# Patient Record
Sex: Female | Born: 1960 | Race: White | Hispanic: No | Marital: Married | Smoking: Never smoker
Health system: Southern US, Community
[De-identification: ages and names within clinical notes are randomized; demographics above are authoritative.]

## PROBLEM LIST (undated history)

## (undated) DIAGNOSIS — A071 Giardiasis [lambliasis]: Secondary | ICD-10-CM

## (undated) DIAGNOSIS — I82409 Acute embolism and thrombosis of unspecified deep veins of unspecified lower extremity: Secondary | ICD-10-CM

## (undated) DIAGNOSIS — E7800 Pure hypercholesterolemia, unspecified: Secondary | ICD-10-CM

## (undated) DIAGNOSIS — Z923 Personal history of irradiation: Secondary | ICD-10-CM

## (undated) DIAGNOSIS — C50919 Malignant neoplasm of unspecified site of unspecified female breast: Secondary | ICD-10-CM

## (undated) DIAGNOSIS — D6851 Activated protein C resistance: Secondary | ICD-10-CM

## (undated) HISTORY — PX: FRACTURE SURGERY: SHX138

## (undated) HISTORY — PX: COSMETIC SURGERY: SHX468

## (undated) HISTORY — DX: Pure hypercholesterolemia, unspecified: E78.00

---

## 1996-09-04 HISTORY — PX: DILATION AND CURETTAGE OF UTERUS: SHX78

## 2007-07-30 ENCOUNTER — Other Ambulatory Visit: Admission: RE | Admit: 2007-07-30 | Discharge: 2007-07-30 | Payer: Self-pay | Admitting: Family Medicine

## 2008-05-20 ENCOUNTER — Encounter: Admission: RE | Admit: 2008-05-20 | Discharge: 2008-05-20 | Payer: Self-pay | Admitting: Family Medicine

## 2008-05-22 ENCOUNTER — Encounter: Admission: RE | Admit: 2008-05-22 | Discharge: 2008-05-22 | Payer: Self-pay | Admitting: Family Medicine

## 2008-08-05 ENCOUNTER — Other Ambulatory Visit: Admission: RE | Admit: 2008-08-05 | Discharge: 2008-08-05 | Payer: Self-pay | Admitting: Family Medicine

## 2008-11-11 ENCOUNTER — Encounter: Admission: RE | Admit: 2008-11-11 | Discharge: 2008-11-11 | Payer: Self-pay | Admitting: Family Medicine

## 2009-06-16 ENCOUNTER — Encounter: Admission: RE | Admit: 2009-06-16 | Discharge: 2009-06-16 | Payer: Self-pay | Admitting: Family Medicine

## 2009-08-19 ENCOUNTER — Other Ambulatory Visit: Admission: RE | Admit: 2009-08-19 | Discharge: 2009-08-19 | Payer: Self-pay | Admitting: Family Medicine

## 2010-07-21 ENCOUNTER — Encounter: Admission: RE | Admit: 2010-07-21 | Discharge: 2010-07-21 | Payer: Self-pay | Admitting: Family Medicine

## 2010-09-26 ENCOUNTER — Encounter: Payer: Self-pay | Admitting: Family Medicine

## 2010-10-13 ENCOUNTER — Other Ambulatory Visit (HOSPITAL_COMMUNITY)
Admission: RE | Admit: 2010-10-13 | Discharge: 2010-10-13 | Disposition: A | Discharge status: Home / Self Care | Payer: BC Managed Care – PPO | Source: Ambulatory Visit | Attending: Family Medicine | Admitting: Family Medicine

## 2010-10-13 ENCOUNTER — Other Ambulatory Visit: Payer: Self-pay | Admitting: Family Medicine

## 2010-10-13 DIAGNOSIS — Z124 Encounter for screening for malignant neoplasm of cervix: Secondary | ICD-10-CM | POA: Insufficient documentation

## 2011-06-22 ENCOUNTER — Other Ambulatory Visit: Payer: Self-pay | Admitting: Family Medicine

## 2011-06-22 DIAGNOSIS — Z1231 Encounter for screening mammogram for malignant neoplasm of breast: Secondary | ICD-10-CM

## 2011-08-01 ENCOUNTER — Ambulatory Visit
Admission: RE | Admit: 2011-08-01 | Discharge: 2011-08-01 | Disposition: A | Discharge status: Home / Self Care | Payer: BC Managed Care – PPO | Source: Ambulatory Visit | Attending: Family Medicine | Admitting: Family Medicine

## 2011-08-01 DIAGNOSIS — Z1231 Encounter for screening mammogram for malignant neoplasm of breast: Secondary | ICD-10-CM

## 2011-09-19 ENCOUNTER — Other Ambulatory Visit: Payer: Self-pay | Admitting: Dermatology

## 2011-10-19 ENCOUNTER — Other Ambulatory Visit (HOSPITAL_COMMUNITY)
Admission: RE | Admit: 2011-10-19 | Discharge: 2011-10-19 | Disposition: A | Discharge status: Home / Self Care | Payer: BC Managed Care – PPO | Source: Ambulatory Visit | Attending: Family Medicine | Admitting: Family Medicine

## 2011-10-19 ENCOUNTER — Other Ambulatory Visit: Payer: Self-pay | Admitting: Family Medicine

## 2011-10-19 DIAGNOSIS — Z124 Encounter for screening for malignant neoplasm of cervix: Secondary | ICD-10-CM | POA: Insufficient documentation

## 2011-10-19 DIAGNOSIS — Z1159 Encounter for screening for other viral diseases: Secondary | ICD-10-CM | POA: Insufficient documentation

## 2012-06-28 ENCOUNTER — Other Ambulatory Visit: Payer: Self-pay | Admitting: Family Medicine

## 2012-06-28 DIAGNOSIS — Z1231 Encounter for screening mammogram for malignant neoplasm of breast: Secondary | ICD-10-CM

## 2012-08-06 ENCOUNTER — Ambulatory Visit
Admission: RE | Admit: 2012-08-06 | Discharge: 2012-08-06 | Disposition: A | Discharge status: Home / Self Care | Payer: BC Managed Care – PPO | Source: Ambulatory Visit | Attending: Family Medicine | Admitting: Family Medicine

## 2012-08-06 DIAGNOSIS — Z1231 Encounter for screening mammogram for malignant neoplasm of breast: Secondary | ICD-10-CM

## 2013-01-06 ENCOUNTER — Other Ambulatory Visit: Payer: Self-pay | Admitting: Family Medicine

## 2013-01-06 ENCOUNTER — Other Ambulatory Visit (HOSPITAL_COMMUNITY)
Admission: RE | Admit: 2013-01-06 | Discharge: 2013-01-06 | Disposition: A | Discharge status: Home / Self Care | Payer: BC Managed Care – PPO | Source: Ambulatory Visit | Attending: Family Medicine | Admitting: Family Medicine

## 2013-01-06 DIAGNOSIS — Z124 Encounter for screening for malignant neoplasm of cervix: Secondary | ICD-10-CM | POA: Insufficient documentation

## 2013-07-08 ENCOUNTER — Other Ambulatory Visit: Payer: Self-pay

## 2013-07-08 DIAGNOSIS — Z1231 Encounter for screening mammogram for malignant neoplasm of breast: Secondary | ICD-10-CM

## 2013-08-07 ENCOUNTER — Ambulatory Visit
Admission: RE | Admit: 2013-08-07 | Discharge: 2013-08-07 | Disposition: A | Discharge status: Home / Self Care | Payer: BC Managed Care – PPO | Source: Ambulatory Visit

## 2013-08-07 DIAGNOSIS — Z1231 Encounter for screening mammogram for malignant neoplasm of breast: Secondary | ICD-10-CM

## 2014-07-20 ENCOUNTER — Other Ambulatory Visit: Payer: Self-pay

## 2014-07-20 DIAGNOSIS — Z1231 Encounter for screening mammogram for malignant neoplasm of breast: Secondary | ICD-10-CM

## 2014-08-10 ENCOUNTER — Ambulatory Visit
Admission: RE | Admit: 2014-08-10 | Discharge: 2014-08-10 | Disposition: A | Discharge status: Home / Self Care | Payer: BC Managed Care – PPO | Source: Ambulatory Visit

## 2014-08-10 DIAGNOSIS — Z1231 Encounter for screening mammogram for malignant neoplasm of breast: Secondary | ICD-10-CM

## 2015-07-15 ENCOUNTER — Other Ambulatory Visit: Payer: Self-pay

## 2015-07-15 DIAGNOSIS — Z1231 Encounter for screening mammogram for malignant neoplasm of breast: Secondary | ICD-10-CM

## 2015-08-19 ENCOUNTER — Ambulatory Visit
Admission: RE | Admit: 2015-08-19 | Discharge: 2015-08-19 | Disposition: A | Discharge status: Home / Self Care | Payer: BLUE CROSS/BLUE SHIELD | Source: Ambulatory Visit

## 2015-08-19 DIAGNOSIS — Z1231 Encounter for screening mammogram for malignant neoplasm of breast: Secondary | ICD-10-CM

## 2015-11-12 ENCOUNTER — Other Ambulatory Visit: Payer: Self-pay | Admitting: Family Medicine

## 2015-11-12 ENCOUNTER — Other Ambulatory Visit (HOSPITAL_COMMUNITY)
Admission: RE | Admit: 2015-11-12 | Discharge: 2015-11-12 | Disposition: A | Discharge status: Home / Self Care | Payer: BLUE CROSS/BLUE SHIELD | Source: Ambulatory Visit | Attending: Family Medicine | Admitting: Family Medicine

## 2015-11-12 DIAGNOSIS — Z1151 Encounter for screening for human papillomavirus (HPV): Secondary | ICD-10-CM | POA: Diagnosis not present

## 2015-11-12 DIAGNOSIS — Z01411 Encounter for gynecological examination (general) (routine) with abnormal findings: Secondary | ICD-10-CM | POA: Diagnosis present

## 2015-11-15 LAB — CYTOLOGY - PAP

## 2016-06-04 DIAGNOSIS — I82409 Acute embolism and thrombosis of unspecified deep veins of unspecified lower extremity: Secondary | ICD-10-CM

## 2016-06-04 HISTORY — DX: Acute embolism and thrombosis of unspecified deep veins of unspecified lower extremity: I82.409

## 2016-06-18 ENCOUNTER — Emergency Department (HOSPITAL_COMMUNITY)
Admission: EM | Admit: 2016-06-18 | Discharge: 2016-06-18 | Disposition: A | Discharge status: Home / Self Care | Payer: BLUE CROSS/BLUE SHIELD | Attending: Emergency Medicine | Admitting: Emergency Medicine

## 2016-06-18 ENCOUNTER — Encounter (HOSPITAL_COMMUNITY): Payer: Self-pay | Admitting: Emergency Medicine

## 2016-06-18 ENCOUNTER — Emergency Department (HOSPITAL_BASED_OUTPATIENT_CLINIC_OR_DEPARTMENT_OTHER)
Admit: 2016-06-18 | Discharge: 2016-06-18 | Disposition: A | Discharge status: Home / Self Care | Payer: BLUE CROSS/BLUE SHIELD

## 2016-06-18 DIAGNOSIS — M79609 Pain in unspecified limb: Secondary | ICD-10-CM | POA: Diagnosis not present

## 2016-06-18 DIAGNOSIS — M7989 Other specified soft tissue disorders: Secondary | ICD-10-CM | POA: Diagnosis not present

## 2016-06-18 DIAGNOSIS — I82492 Acute embolism and thrombosis of other specified deep vein of left lower extremity: Secondary | ICD-10-CM | POA: Diagnosis not present

## 2016-06-18 DIAGNOSIS — M79662 Pain in left lower leg: Secondary | ICD-10-CM | POA: Diagnosis present

## 2016-06-18 HISTORY — DX: Acute embolism and thrombosis of unspecified deep veins of unspecified lower extremity: I82.409

## 2016-06-18 LAB — I-STAT CHEM 8, ED
BUN: 16 mg/dL (ref 6–20)
Calcium, Ion: 1.18 mmol/L (ref 1.15–1.40)
Chloride: 102 mmol/L (ref 101–111)
Creatinine, Ser: 0.7 mg/dL (ref 0.44–1.00)
Glucose, Bld: 91 mg/dL (ref 65–99)
HCT: 37 % (ref 36.0–46.0)
Hemoglobin: 12.6 g/dL (ref 12.0–15.0)
Potassium: 4 mmol/L (ref 3.5–5.1)
Sodium: 138 mmol/L (ref 135–145)
TCO2: 25 mmol/L (ref 0–100)

## 2016-06-18 MED ORDER — RIVAROXABAN (XARELTO) EDUCATION KIT FOR DVT/PE PATIENTS
PACK | Freq: Once | Status: AC
  Administered 2016-06-18: 22:00:00
  Filled 2016-06-18: qty 1

## 2016-06-18 MED ORDER — RIVAROXABAN (XARELTO) VTE STARTER PACK (15 & 20 MG): ORAL_TABLET | ORAL | 0 refills | Status: DC

## 2016-06-18 MED ORDER — RIVAROXABAN 15 MG PO TABS
15.0000 mg | ORAL_TABLET | ORAL | Status: AC
  Administered 2016-06-18: 15 mg via ORAL
  Filled 2016-06-18: qty 1

## 2016-06-18 NOTE — Progress Notes (Signed)
ANTICOAGULATION CONSULT NOTE - Initial Consult  Pharmacy Consult for Xarelto Indication: DVT  No Known Allergies  Patient Measurements: Height: 5\' 7"  (170.2 cm) Weight: 147 lb (66.7 kg) IBW/kg (Calculated) : 61.6  Vital Signs: Temp: 97.9 F (36.6 C) (10/15 1603) Temp Source: Oral (10/15 1603) BP: 100/83 (10/15 2038) Pulse Rate: 69 (10/15 2038)  Labs:  Recent Labs  06/18/16 2049  HGB 12.6  HCT 37.0  CREATININE 0.70    Estimated Creatinine Clearance: 79.1 mL/min (by C-G formula based on SCr of 0.7 mg/dL).   Medical History: Past Medical History:  Diagnosis Date  . DVT (deep venous thrombosis) (HCC)     Medications:  No prescription medications prior to admission   Assessment: 55 yo female with a hx factor V Leiden deficiency, now (+) DVT after recent travel. Pharmacy consulted to dose Xarelto.  Goal of Therapy:  Therapeutic anticoagulation   Plan:  Xarelto 15mg  PO bid x 21 days, then 20mg  PO daily Spoke with patient and education materials provided   Peggyann Juba, PharmD, BCPS Pager: [phone] 06/18/2016,8:51 PM

## 2016-06-18 NOTE — ED Triage Notes (Signed)
Patient c/o left lower leg pain and swelling since last week.  Patient flew last week and returned yesterday.  Patient had DVT 20 years ago and states that symptoms feel the same. Patient denies any blood thinners.

## 2016-06-18 NOTE — ED Provider Notes (Signed)
[facility] DEPT Provider Note   CSN: [id number] Arrival date & time: 06/18/16  1551     History   Chief Complaint Chief Complaint  Patient presents with  . Leg Pain  . Leg Swelling    HPI Alyssa Riley is a 55 y.o. female with a past medical history of DVT who presents to the ED stay complaining of left calf pain and swelling onset 6 days ago. Patient states that she flew back from [place] 6 days ago. Since that time she has had ongoing and worsening left calf pain and minimal swelling. Patient states she had a DVT 20 years ago and this feels exactly the same way that it did before. She was diagnosed with factor V Leiden deficiency. She no longer takes anticoagulation therapy. She denies any chest pain, shortness of breath.  HPI  Past Medical History:  Diagnosis Date  . DVT (deep venous thrombosis) ([facility])     There are no active problems to display for this patient.   Past Surgical History:  Procedure Laterality Date  . DILATION AND CURETTAGE OF UTERUS    . FRACTURE SURGERY      OB History    No data available       Home Medications    Prior to Admission medications   Not on File    Family History No family history on file.  Social History Social History  Substance Use Topics  . Smoking status: Never Smoker  . Smokeless tobacco: Never Used  . Alcohol use Yes     Comment: social      Allergies   Review of patient's allergies indicates no known allergies.   Review of Systems Review of Systems  All other systems reviewed and are negative.    Physical Exam Updated Vital Signs BP 123/56   Pulse 70   Temp 97.9 F (36.6 C) (Oral)   Resp 16   Ht 5\' 7"  (1.702 m)   Wt 66.7 kg   SpO2 100%   BMI 23.02 kg/m   Physical Exam  Constitutional: She is oriented to person, place, and time. She appears well-developed and well-nourished. No distress.  HENT:  Head: Normocephalic and atraumatic.  Eyes: Conjunctivae are normal. Right eye exhibits  no discharge. Left eye exhibits no discharge. No scleral icterus.  Cardiovascular: Normal rate, regular rhythm and normal heart sounds.   Pulmonary/Chest: Effort normal and breath sounds normal.  Musculoskeletal:  TTP over left cal, minimal swelling. Positive Homan's sign. Intact distal pulses. Skin warm and pink  Neurological: She is alert and oriented to person, place, and time. Coordination normal.  Skin: Skin is warm and dry. No rash noted. She is not diaphoretic. No erythema. No pallor.  Psychiatric: She has a normal mood and affect. Her behavior is normal.  Nursing note and vitals reviewed.    ED Treatments / Results  Labs (all labs ordered are listed, but only abnormal results are displayed) Labs Reviewed - No data to display  EKG  EKG Interpretation None       Radiology No results found.  Procedures Procedures (including critical care time)  Medications Ordered in ED Medications - No data to display   Initial Impression / Assessment and Plan / ED Course  I have reviewed the triage vital signs and the nursing notes.  Pertinent labs & imaging results that were available during my care of the patient were reviewed by me and considered in my medical decision making (see chart for details).  Clinical  Course   55 year old female with history of factor V Leiden deficiency presents the ED today complaining of left calf pain onset 5 days ago after a flight to [place]. She had DVT in the same calf 20 years ago and states this feels similar. DVT noted on ultrasound in the gastrocnemius vein as well as in the popliteal vein. No chest pain or shortness of breath to suggest PE. Vitals are stable. Spoke with pharmacy, will obtain istat Chem 8 to evaluate renal function. First dose of 15 mg will be given here. Pharmacy will be by to educate pt and consult in ED as well.  Patient has normal renal function. We'll write prescription for Xarelto starter pack and she will follow-up  with PCP next week for further evaluation and medication management.  Final Clinical Impressions(s) / ED Diagnoses   Final diagnoses:  Acute deep vein thrombosis (DVT) of other specified vein of left lower extremity [facility])    New Prescriptions Discharge Medication List as of 06/18/2016  9:38 PM    START taking these medications   Details  Rivaroxaban 15 & 20 MG TBPK Take as directed on package: Start with one 15mg  tablet by mouth twice a day with food. On Day 22, switch to one 20mg  tablet once a day with food., Print         Dondra Spry [place], [place] 06/18/16 2208    Isla Pence, MD 06/18/16 2308

## 2016-06-18 NOTE — ED Notes (Signed)
Vas Tech in to room.

## 2016-06-18 NOTE — Progress Notes (Addendum)
VASCULAR LAB PRELIMINARY  PRELIMINARY  PRELIMINARY  PRELIMINARY  Left lower extremity venous duplex completed.    Preliminary report:  There is acute DVT noted throughout the gastrocnemius vein.  There is significant sluggish flow noted in the popliteal vein, but it compresses well and has adequate Doppler flow.  Gave report to [employer], PA-C  Xochil Shanker, RVT 06/18/2016, 8:11 PM

## 2016-06-18 NOTE — Discharge Instructions (Addendum)
Information on my medicine - XARELTO (rivaroxaban)  This medication education was reviewed with me or my healthcare representative as part of my discharge preparation.  The pharmacist that spoke with me during my hospital stay was:  Emiliano Dyer, [place] [facility]  WHY WAS [place] YOU? Xarelto was prescribed to treat blood clots that may have been found in the veins of your legs (deep vein thrombosis) or in your lungs (pulmonary embolism) and to reduce the risk of them occurring again.  What do you need to know about Xarelto? The starting dose is one 15 mg tablet taken TWICE daily with food for the FIRST 21 DAYS then on November 6th, the dose is changed to one 20 mg tablet taken ONCE A DAY with your evening meal.  DO NOT stop taking Xarelto without talking to the health care provider who prescribed the medication.  Refill your prescription for 20 mg tablets before you run out.  After discharge, you should have regular check-up appointments with your healthcare provider that is prescribing your Xarelto.  In the future your dose may need to be changed if your kidney function changes by a significant amount.  What do you do if you miss a dose? If you are taking Xarelto TWICE DAILY and you miss a dose, take it as soon as you remember. You may take two 15 mg tablets (total 30 mg) at the same time then resume your regularly scheduled 15 mg twice daily the next day.  If you are taking Xarelto ONCE DAILY and you miss a dose, take it as soon as you remember on the same day then continue your regularly scheduled once daily regimen the next day. Do not take two doses of Xarelto at the same time.   Important Safety Information Xarelto is a blood thinner medicine that can cause bleeding. You should call your healthcare provider right away if you experience any of the following: ? Bleeding from an injury or your nose that does not stop. ? Unusual colored urine (red or dark brown) or unusual  colored stools (red or black). ? Unusual bruising for unknown reasons. ? A serious fall or if you hit your head (even if there is no bleeding).  Some medicines may interact with Xarelto and might increase your risk of bleeding while on Xarelto. To help avoid this, consult your healthcare provider or pharmacist prior to using any new prescription or non-prescription medications, including herbals, vitamins, non-steroidal anti-inflammatory drugs (NSAIDs) and supplements.  This website has more information on Xarelto: [email or web address]

## 2016-07-18 ENCOUNTER — Encounter: Payer: Self-pay | Admitting: Hematology

## 2016-08-01 ENCOUNTER — Other Ambulatory Visit: Payer: Self-pay | Admitting: Family Medicine

## 2016-08-01 DIAGNOSIS — Z1231 Encounter for screening mammogram for malignant neoplasm of breast: Secondary | ICD-10-CM

## 2016-08-23 ENCOUNTER — Ambulatory Visit: Payer: BLUE CROSS/BLUE SHIELD

## 2016-08-31 ENCOUNTER — Ambulatory Visit
Admission: RE | Admit: 2016-08-31 | Discharge: 2016-08-31 | Disposition: A | Discharge status: Home / Self Care | Payer: BLUE CROSS/BLUE SHIELD | Source: Ambulatory Visit | Attending: Family Medicine | Admitting: Family Medicine

## 2016-08-31 DIAGNOSIS — Z1231 Encounter for screening mammogram for malignant neoplasm of breast: Secondary | ICD-10-CM

## 2016-09-04 DIAGNOSIS — Z923 Personal history of irradiation: Secondary | ICD-10-CM

## 2016-09-04 HISTORY — DX: Personal history of irradiation: Z92.3

## 2016-09-05 ENCOUNTER — Other Ambulatory Visit: Payer: Self-pay | Admitting: Family Medicine

## 2016-09-05 DIAGNOSIS — R928 Other abnormal and inconclusive findings on diagnostic imaging of breast: Secondary | ICD-10-CM

## 2016-09-07 ENCOUNTER — Encounter: Payer: Self-pay | Admitting: Hematology

## 2016-09-07 ENCOUNTER — Ambulatory Visit (HOSPITAL_BASED_OUTPATIENT_CLINIC_OR_DEPARTMENT_OTHER): Payer: Self-pay | Admitting: Hematology

## 2016-09-07 VITALS — BP 102/61 | HR 78 | Temp 98.7°F | Resp 18 | Ht 67.0 in | Wt 149.0 lb

## 2016-09-07 DIAGNOSIS — D6851 Activated protein C resistance: Secondary | ICD-10-CM

## 2016-09-07 DIAGNOSIS — I82492 Acute embolism and thrombosis of other specified deep vein of left lower extremity: Secondary | ICD-10-CM

## 2016-09-07 DIAGNOSIS — I82401 Acute embolism and thrombosis of unspecified deep veins of right lower extremity: Secondary | ICD-10-CM

## 2016-09-07 DIAGNOSIS — N649 Disorder of breast, unspecified: Secondary | ICD-10-CM

## 2016-09-07 NOTE — Progress Notes (Signed)
[facility]    HEMATOLOGY/ONCOLOGY CONSULTATION NOTE  Date of Service: 09/07/2016  PCP: Dr Jonathon Jordan MD  [place]:  H/o DVT in the setting of k/h/o Factor V leiden mutation  HISTORY OF PRESENTING ILLNESS:   Alyssa Riley is a wonderful 56 y.o. female who has been referred to [place] by Dr Audley Hose for evaluation and management of acute DVT in a patient with known history of Factor V leiden  Mutation.  Patient notes that she has a known history of factor V leiden mutation (she notes it was probably heterozygous mutation) and had a LLE DVT in 1997 which was apparently treated with coumadin anticoagulation for 3 months. It was thought to be provoked by a long distance flight from [place] at that time.  Prior to this she apparently was on OCP's for several years without any VTE events  She notes that she has since had 4 pregnancies and 2 live births. Was on lovenox prophylaxis during her pregnancies and did not have any VTE events.  Patient recently in 06/2016 had left leg discomfort after working out in the [place] and flight to [place]. Had an [place] LE venous which showed acute DVT of left gastrocnemium vein. Patient has been on anticoagulation with Xarelto for this and notes that all her symptoms from the DVT have resolved.  No chest pain or shortness of breath.  No current use of HRT. No recent surgeries. No smoker. No other obvious provoking factors. No overt injuries to the leg.  MEDICAL HISTORY:  Past Medical History:  Diagnosis Date  . DVT (deep venous thrombosis) (HCC)   factor V leiden mutation.  SURGICAL HISTORY: Past Surgical History:  Procedure Laterality Date  . DILATION AND CURETTAGE OF UTERUS    . FRACTURE SURGERY      SOCIAL HISTORY: Social History   Social History  . Marital status: Married    Spouse name: N/A  . Number of children: N/A  . Years of education: N/A   Occupational History  . Not on file.   Social History Main Topics    . Smoking status: Never Smoker  . Smokeless tobacco: Never Used  . Alcohol use Yes     Comment: social   . Drug use: No  . Sexual activity: Not on file   Other Topics Concern  . Not on file   Social History Narrative  . No narrative on file    FAMILY HISTORY: Patient notes she was adopted and does not know her biological parents family history.   ALLERGIES:  has No Known Allergies.  MEDICATIONS:  Current Outpatient Prescriptions  Medication Sig Dispense Refill  . Rivaroxaban 15 & 20 MG TBPK Take as directed on package: Start with one 15mg  tablet by mouth twice a day with food. On Day 22, switch to one 20mg  tablet once a day with food. 51 each 0   No current facility-administered medications for this visit.     REVIEW OF SYSTEMS:    10 Point review of Systems was done is negative except as noted above.  PHYSICAL EXAMINATION: ECOG PERFORMANCE STATUS: 0 - Asymptomatic  . Vitals:   09/07/16 1402  BP: 102/61  Pulse: 78  Resp: 18  Temp: 98.7 F (37.1 C)   Filed Weights   09/07/16 1402  Weight: 149 lb (67.6 kg)   .Body mass index is 23.34 kg/m.  GENERAL:alert, in no acute distress and comfortable SKIN: skin color, texture, turgor are normal, no rashes or significant lesions  EYES: normal, conjunctiva are pink and non-injected, sclera clear OROPHARYNX:no exudate, no erythema and lips, buccal mucosa, and tongue normal  NECK: supple, no JVD, thyroid normal size, non-tender, without nodularity LYMPH:  no palpable lymphadenopathy in the cervical, axillary or inguinal LUNGS: clear to auscultation with normal respiratory effort HEART: regular rate & rhythm,  no murmurs and no lower extremity edema ABDOMEN: abdomen soft, non-tender, normoactive bowel sounds , no palpable hepato-splenomegaly. Musculoskeletal: no leg redness/tenderness or pain or swelling at this time.  PSYCH: alert & oriented x 3 with fluent speech NEURO: no focal motor/sensory deficits  LABORATORY  DATA:  I have reviewed the data as listed  . CBC Latest Ref Rng & Units 06/18/2016  Hemoglobin 12.0 - 15.0 g/dL 12.6  Hematocrit 36.0 - 46.0 % 37.0    . CMP Latest Ref Rng & Units 06/18/2016  Glucose 65 - 99 mg/dL 91  BUN 6 - 20 mg/dL 16  Creatinine 0.44 - 1.00 mg/dL 0.70  Sodium 135 - 145 mmol/L 138  Potassium 3.5 - 5.1 mmol/L 4.0  Chloride 101 - 111 mmol/L 102     RADIOGRAPHIC STUDIES: I have personally reviewed the radiological images as listed and agreed with the findings in the report. Mm Digital Screening Bilateral  Result Date: 09/01/2016 CLINICAL DATA:  Screening. EXAM: DIGITAL SCREENING BILATERAL MAMMOGRAM WITH CAD COMPARISON:  Previous exam(s). ACR Breast Density Category c: The breast tissue is heterogeneously dense, which may obscure small masses. FINDINGS: In the left breast, a possible asymmetry warrants further evaluation. This possible asymmetry is seen within the outer left breast at posterior depth on the CC view, with possible corresponding distortion seen within the upper left breast on the MLO view. In the right breast, no findings suspicious for malignancy. Images were processed with CAD. IMPRESSION: Further evaluation is suggested for possible asymmetry/distortion in the left breast. RECOMMENDATION: Diagnostic mammogram and possibly ultrasound of the left breast. (Code:FI-L-31M) The patient will be contacted regarding the findings, and additional imaging will be scheduled. BI-RADS CATEGORY  0: Incomplete. Need additional imaging evaluation and/or prior mammograms for comparison. Electronically Signed   By: Franki Cabot M.D.   On: 09/01/2016 14:33   [place] ext venous LE b/l  Summary: Findings consistent with acute deep vein thrombosis involving the gastrocnemius vein of the left lower extremity.  Other specific details can be found in the table(s) above. Prepared and Electronically Authenticated by  Servando Snare, MD 2017-10-16T10:08:54  ASSESSMENT & PLAN:     56 yo caucasian female with   1) h/o Factor V leiden mutation (apparently heterozygous -- but we do not have results available to [place]) 2) Recurrent provoked LLE DVT  1st in 1997 provoked by long distance travel from [place] 2nd recently in 06/2016 -- possible provoked with flight to [place] + ?muscle strain with [employer] workout. Increasing age would be an additional factor. Plan -I discussed with the patient that FVL mutation is an inherited thrombophilia. Assuming Heterozygous FVL mutation the risk of VTE is 5-7 fold that of comparable population.If homozygous mutation <=1% of patients - could be 50-70X risk of VTE. -given that her recent event was provoked --would recommend Xarelto for 6 months following by baby ASA lifelong (assuming heterozygous FVL mutation. Would need lifelong anticoagulation if homozygous FVL mutation) -we discuss VTE prevention strategies while in long distance flight. -could consider prophylactic anticoagulation starting 1-2 days prior to long distance travel and for 1 day after completion of travel in the future when off anticoagulation. -if she has any  unprovoked VTE events or clinical significantly VTE events -- one could make a stronger case for consideration of long term anticoagulation. -avoid NSAIDS while on anticoagulation to reduce risk of bleeding. -would avoid HRT -f/u for age appropriate cancer screening with PCP  3) left breast mammographic abnormality -has been scheduled for diagnostic MMG + [place] -to f/u with PCP  Kindly call if any additional questions arise.  All of the patients questions were answered with apparent satisfaction. The patient knows to call the clinic with any problems, questions or concerns.  I spent 45 minutes counseling the patient face to face. The total time spent in the appointment was 60 minutes and more than 50% was on counseling and direct patient cares.    Sullivan Lone MD [place] AAHIVMS [facility] Hematology/Oncology Physician [facility]  (Office):       [phone] (Work cell):  [phone] (Fax):           [phone]  09/07/2016 2:15 PM

## 2016-09-11 ENCOUNTER — Other Ambulatory Visit: Payer: Self-pay | Admitting: Family Medicine

## 2016-09-11 ENCOUNTER — Ambulatory Visit
Admission: RE | Admit: 2016-09-11 | Discharge: 2016-09-11 | Disposition: A | Discharge status: Home / Self Care | Payer: No Typology Code available for payment source | Source: Ambulatory Visit | Attending: Family Medicine | Admitting: Family Medicine

## 2016-09-11 DIAGNOSIS — R928 Other abnormal and inconclusive findings on diagnostic imaging of breast: Secondary | ICD-10-CM

## 2016-09-11 DIAGNOSIS — N6489 Other specified disorders of breast: Secondary | ICD-10-CM

## 2016-09-13 ENCOUNTER — Ambulatory Visit
Admission: RE | Admit: 2016-09-13 | Discharge: 2016-09-13 | Disposition: A | Discharge status: Home / Self Care | Payer: No Typology Code available for payment source | Source: Ambulatory Visit | Attending: Family Medicine | Admitting: Family Medicine

## 2016-09-13 ENCOUNTER — Other Ambulatory Visit: Payer: Self-pay | Admitting: Family Medicine

## 2016-09-13 DIAGNOSIS — N6489 Other specified disorders of breast: Secondary | ICD-10-CM

## 2016-09-13 DIAGNOSIS — C50919 Malignant neoplasm of unspecified site of unspecified female breast: Secondary | ICD-10-CM

## 2016-09-13 HISTORY — DX: Malignant neoplasm of unspecified site of unspecified female breast: C50.919

## 2016-09-25 ENCOUNTER — Ambulatory Visit: Payer: Self-pay | Admitting: Surgery

## 2016-09-25 DIAGNOSIS — N6092 Unspecified benign mammary dysplasia of left breast: Secondary | ICD-10-CM

## 2016-09-25 NOTE — H&P (Signed)
History of Present Illness Alyssa Riley. Alyssa Craun MD; 09/25/2016 10:46 AM) The patient is a 56 year old female who presents with a breast mass. 56 yo female in good health presents after recent screening mammogram. This showed an area of asymmetry in the upper outer quadrant at posterior depth. Further workup including a core biopsy showed atypical ductal hyperplasia with microcalcifications. A clip was placed. She presents now to discuss surgical options. The patient has no breast complaints prior to her mammogram. She has had no previous history. 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 She was on blood thinners but is no longer on blood thinners. She has been evaluated by Dr. Irene Limbo at the cancer center.   Menarche - age 37 First Pregnancy - 63; 2 miscarriages/ 2 live births Breastfeed - yes Hormones - OCP less than 10 years No Family history - adopted Menopause - age 89   CLINICAL DATA: Screening.  EXAM: DIGITAL SCREENING BILATERAL MAMMOGRAM WITH CAD  COMPARISON: Previous exam(s).  ACR Breast Density Category c: The breast tissue is heterogeneously dense, which may obscure small masses.  FINDINGS: In the left breast, a possible asymmetry warrants further evaluation. This possible asymmetry is seen within the outer left breast at posterior depth on the CC view, with possible corresponding distortion seen within the upper left breast on the MLO view.  In the right breast, no findings suspicious for malignancy. Images were processed with CAD.  IMPRESSION: Further evaluation is suggested for possible asymmetry/distortion in the left breast.  RECOMMENDATION: Diagnostic mammogram and possibly ultrasound of the left breast. (Code:[id number])  The patient will be contacted regarding the findings, and additional imaging will be scheduled.  BI-RADS CATEGORY 0: Incomplete. Need additional imaging evaluation and/or prior mammograms for  comparison.   Electronically Signed By: Franki Cabot M.D. On: 09/01/2016 14:33  CLINICAL DATA: Screening recall for a possible asymmetry in the left breast.  EXAM: 2D DIGITAL DIAGNOSTIC LEFT MAMMOGRAM WITH CAD AND ADJUNCT TOMO  ULTRASOUND LEFT BREAST  COMPARISON: Previous exam(s).  ACR Breast Density Category c: The breast tissue is heterogeneously dense, which may obscure small masses.  FINDINGS: On the diagnostic images, the possible asymmetry disperses. However, there is evidence of architectural distortion in the posterior upper outer quadrant. This is evident on both the CC and MLO views. There are no discrete masses. There are no suspicious calcifications.  Mammographic images were processed with CAD.  On physical exam, no mass is palpated in the upper outer left breast.  Targeted ultrasound is performed, showing no mass or cyst. No sonographic architectural distortion. There is heterogeneous partly shadowing fibroglandular tissue throughout the upper outer quadrant.  IMPRESSION: Architectural distortion in the upper outer aspect of the left breast suspicious for possible malignancy. Biopsy is recommended.  RECOMMENDATION: Affirm stereotactic core needle biopsy. Patient will be scheduled for this procedure.  I have discussed the findings and recommendations with the patient. Results were also provided in writing at the conclusion of the visit. If applicable, a reminder letter will be sent to the patient regarding the next appointment.  BI-RADS CATEGORY 4: Suspicious.   Electronically Signed By: Lajean Manes M.D. On: 09/11/2016 14:52  CLINICAL DATA: Left breast upper outer quadrant area of distortion.  EXAM: LEFT BREAST STEREOTACTIC CORE NEEDLE BIOPSY  COMPARISON: Previous exams.  FINDINGS: The patient and I discussed the procedure of stereotactic-guided biopsy including benefits and alternatives. We discussed the high likelihood of a  successful procedure. We discussed the risks of  the procedure including infection, bleeding, tissue injury, clip migration, and inadequate sampling. Informed written consent was given. The usual time out protocol was performed immediately prior to the procedure.  Using sterile technique and 1% Lidocaine as local anesthetic, under stereotactic guidance, a 9 gauge vacuum assisted device was used to perform core needle biopsy of left breast upper outer quadrant distortion using a lateral approach. Specimen radiograph was not performed.  At the conclusion of the procedure, a coil shaped tissue marker clip was deployed into the biopsy cavity. Follow-up 2-view mammogram was performed and dictated separately.  IMPRESSION: Stereotactic-guided biopsy of the left breast. No apparent complications.  Electronically Signed: By: Fidela Salisbury M.D. On: 09/13/2016 16:35   CLINICAL DATA: Post stereotactic core needle biopsy of the left breast.  EXAM: DIAGNOSTIC LEFT MAMMOGRAM POST STEREOTACTIC BIOPSY  COMPARISON: Previous exam(s).  FINDINGS: Mammographic images were obtained following stereotactic guided biopsy of left breast upper outer quadrant distortion. The coil shaped marker is noted to be 9 mm superior to the geometric center of the asymmetry on the ML view and 1.6 cm anterolateral to the presumed geometric center of the asymmetry on the craniocaudal view.  IMPRESSION: Successful placement of coil shaped tissue marker within left breast upper outer quadrant biopsy site.  Given the appearance of this abnormality, surgical excision may be necessary regardless of the pathology results. These findings and recommendations were discussed with the patient immediately after the procedure. Tomosynthesis guided radioactive seed placement may be necessary prior to the surgical procedure. The geometric center in ML projection may be chosen for seed placement, if the  pathology results from this stereotactic biopsy are benign and nonspecific.  Final Assessment: Post Procedure Mammograms for Marker Placement   Electronically Signed By: Fidela Salisbury M.D. On: 09/13/2016 16:54   Diagnosis Breast, left, needle core biopsy, UOQ - ATYPICAL DUCTAL HYPERPLASIA, FOCAL, IN SCLEROSING ADENOSIS WITH CALCIFICATIONS. - FIBROADENOMA. - SEE COMMENT. Microscopic Comment The results were called to [facility] on 09/14/2016. (JBK:ecj 09/14/2016) Enid Cutter MD Pathologist, Electronic Signature (Case signed 09/14/2016   Past Surgical History Malachy Moan, [place]; 09/25/2016 9:56 AM) Breast Biopsy Left. Foot Surgery Left. Oral Surgery  Diagnostic Studies History Malachy Moan, [place]; 09/25/2016 9:56 AM) Colonoscopy never Mammogram within last year Pap Smear 1-5 years ago  Allergies Malachy Moan, RMA; 09/25/2016 9:59 AM) No Known Drug Allergies 09/25/2016  Medication History Malachy Moan, RMA; 09/25/2016 9:59 AM) Alveda Reasons (20MG  Tablet, Oral) Active. Rivaroxaban (15 & 20MG  Tab Ther Pack, Oral) Active. Medications Reconciled  Social History Malachy Moan, [place]; 09/25/2016 9:56 AM) Alcohol use Moderate alcohol use. No caffeine use No drug use Tobacco use Never smoker.  Family History Malachy Moan, [place]; 09/25/2016 9:56 AM) Family history unknown First Degree Relatives  Pregnancy / Birth History Malachy Moan, [place]; 09/25/2016 9:56 AM) Age at menarche 80 years. Age of menopause 50-50 Contraceptive History Oral contraceptives. Gravida 4 Length (months) of breastfeeding 3-6 Maternal age 88-35 Para 2  Other Problems Malachy Moan, [place]; 09/25/2016 9:56 AM) No pertinent past medical history     Review of Systems Malachy Moan RMA; 09/25/2016 9:56 AM) General Not Present- Appetite Loss, Chills, Fatigue, Fever, Night Sweats, Weight Gain and Weight Loss. Skin Not Present-  Change in Wart/Mole, Dryness, Hives, Jaundice, New Lesions, Non-Healing Wounds, Rash and Ulcer. HEENT Present- Wears glasses/contact lenses. Not Present- Earache, Hearing Loss, Hoarseness, Nose Bleed, Oral Ulcers, Ringing in the Ears, Seasonal Allergies, Sinus Pain, Sore Throat, Visual Disturbances and Yellow Eyes. Respiratory Not Present- Bloody sputum,  Chronic Cough, Difficulty Breathing, Snoring and Wheezing. Breast Not Present- Breast Mass, Breast Pain, Nipple Discharge and Skin Changes. Cardiovascular Not Present- Chest Pain, Difficulty Breathing Lying Down, Leg Cramps, Palpitations, Rapid Heart Rate, Shortness of Breath and Swelling of Extremities. Gastrointestinal Not Present- Abdominal Pain, Bloating, Bloody Stool, Change in Bowel Habits, Chronic diarrhea, Constipation, Difficulty Swallowing, Excessive gas, Gets full quickly at meals, Hemorrhoids, Indigestion, Nausea, Rectal Pain and Vomiting. Female Genitourinary Not Present- Frequency, Nocturia, Painful Urination, Pelvic Pain and Urgency. Musculoskeletal Not Present- Back Pain, Joint Pain, Joint Stiffness, Muscle Pain, Muscle Weakness and Swelling of Extremities. Neurological Not Present- Decreased Memory, Fainting, Headaches, Numbness, Seizures, Tingling, Tremor, Trouble walking and Weakness. Psychiatric Not Present- Anxiety, Bipolar, Change in Sleep Pattern, Depression, Fearful and Frequent crying. Endocrine Not Present- Cold Intolerance, Excessive Hunger, Hair Changes, Heat Intolerance, Hot flashes and New Diabetes. Hematology Not Present- Blood Thinners, Easy Bruising, Excessive bleeding, Gland problems, HIV and Persistent Infections.  Vitals Malachy Moan RMA; 09/25/2016 10:00 AM) 09/25/2016 9:59 AM Weight: 146.4 lb Height: 67in Body Surface Area: 1.77 m Body Mass Index: 22.93 kg/m  Temp.: 15F  Pulse: 68 (Regular)  BP: 100/60 (Sitting, Left Arm, Standard)      Physical Exam Rodman Key K. Doratha Mcswain MD; 09/25/2016 10:47  AM)  The physical exam findings are as follows: Note:WDWN in NAD Eyes: Pupils equal, round; sclera anicteric HENT: Oral mucosa moist; good dentition Neck: No masses palpated, no thyromegaly Lungs: CTA bilaterally; normal respiratory effort Breasts: symmetric; no nipple retraction or discharge; no axillary lymphadenopathy; no palpable masses on either side; healing left upper outer quadrant biopsy site CV: Regular rate and rhythm; no murmurs; extremities well-perfused with no edema Abd: +bowel sounds, soft, non-tender, no palpable organomegaly; no palpable hernias Skin: Warm, dry; no sign of jaundice Psychiatric - alert and oriented x 4; calm mood and affect    Assessment & Plan Rodman Key K. Deshaun Weisinger MD; 09/25/2016 10:48 AM)  ATYPICAL DUCTAL HYPERPLASIA OF LEFT BREAST (N60.92)  Current Plans Schedule for Surgery - Left radioactive seed localized lumpectomy. The surgical procedure has been discussed with the patient. Potential risks, benefits, alternative treatments, and expected outcomes have been explained. All of the patient's questions at this time have been answered. The likelihood of reaching the patient's treatment goal is good. The patient understand the proposed surgical procedure and wishes to proceed. Note:I spent about 25 minutes with the patient and her husband discussing the options for treatment. She had a lot of questions, especially about the downtime after surgery. I answered all of her questions and they wish to proceed.  Alyssa Riley. Georgette Dover, MD, [facility] Surgery  General/ Trauma Surgery  09/25/2016 10:49 AM

## 2016-10-04 ENCOUNTER — Other Ambulatory Visit: Payer: Self-pay | Admitting: Surgery

## 2016-10-04 ENCOUNTER — Encounter (HOSPITAL_BASED_OUTPATIENT_CLINIC_OR_DEPARTMENT_OTHER): Payer: Self-pay | Admitting: *Deleted

## 2016-10-04 DIAGNOSIS — N6092 Unspecified benign mammary dysplasia of left breast: Secondary | ICD-10-CM

## 2016-10-05 HISTORY — PX: BREAST LUMPECTOMY: SHX2

## 2016-10-09 ENCOUNTER — Ambulatory Visit
Admission: RE | Admit: 2016-10-09 | Discharge: 2016-10-09 | Disposition: A | Discharge status: Home / Self Care | Payer: No Typology Code available for payment source | Source: Ambulatory Visit | Attending: Surgery | Admitting: Surgery

## 2016-10-09 DIAGNOSIS — N6092 Unspecified benign mammary dysplasia of left breast: Secondary | ICD-10-CM

## 2016-10-09 NOTE — Progress Notes (Signed)
Boost drink given with instructions to complete by 0600, pt verbalized understanding.

## 2016-10-10 ENCOUNTER — Encounter (HOSPITAL_BASED_OUTPATIENT_CLINIC_OR_DEPARTMENT_OTHER)
Admission: RE | Disposition: A | Discharge status: Home / Self Care | Payer: Self-pay | Source: Ambulatory Visit | Attending: Surgery

## 2016-10-10 ENCOUNTER — Encounter (HOSPITAL_BASED_OUTPATIENT_CLINIC_OR_DEPARTMENT_OTHER): Payer: Self-pay | Admitting: Anesthesiology

## 2016-10-10 ENCOUNTER — Ambulatory Visit (HOSPITAL_BASED_OUTPATIENT_CLINIC_OR_DEPARTMENT_OTHER): Payer: Self-pay | Admitting: Anesthesiology

## 2016-10-10 ENCOUNTER — Ambulatory Visit
Admit: 2016-10-10 | Discharge: 2016-10-10 | Disposition: A | Discharge status: Home / Self Care | Payer: No Typology Code available for payment source | Attending: Surgery | Admitting: Surgery

## 2016-10-10 ENCOUNTER — Ambulatory Visit (HOSPITAL_BASED_OUTPATIENT_CLINIC_OR_DEPARTMENT_OTHER)
Admission: RE | Admit: 2016-10-10 | Discharge: 2016-10-10 | Disposition: A | Discharge status: Home / Self Care | Payer: Self-pay | Source: Ambulatory Visit | Attending: Surgery | Admitting: Surgery

## 2016-10-10 DIAGNOSIS — N6092 Unspecified benign mammary dysplasia of left breast: Secondary | ICD-10-CM | POA: Insufficient documentation

## 2016-10-10 HISTORY — DX: Activated protein C resistance: D68.51

## 2016-10-10 HISTORY — PX: BREAST LUMPECTOMY WITH RADIOACTIVE SEED LOCALIZATION: SHX6424

## 2016-10-10 SURGERY — BREAST LUMPECTOMY WITH RADIOACTIVE SEED LOCALIZATION
Anesthesia: General | Site: Breast | Laterality: Left

## 2016-10-10 MED ORDER — FENTANYL CITRATE (PF) 100 MCG/2ML IJ SOLN: 50.0000 ug | INTRAMUSCULAR | Status: DC | PRN

## 2016-10-10 MED ORDER — CEFAZOLIN SODIUM-DEXTROSE 2-4 GM/100ML-% IV SOLN
INTRAVENOUS | Status: AC
  Filled 2016-10-10: qty 100

## 2016-10-10 MED ORDER — CELECOXIB 400 MG PO CAPS
400.0000 mg | ORAL_CAPSULE | ORAL | Status: AC
  Administered 2016-10-10: 400 mg via ORAL

## 2016-10-10 MED ORDER — CHLORHEXIDINE GLUCONATE CLOTH 2 % EX PADS: 6.0000 | MEDICATED_PAD | Freq: Once | CUTANEOUS | Status: DC

## 2016-10-10 MED ORDER — PROPOFOL 10 MG/ML IV BOLUS
INTRAVENOUS | Status: DC | PRN
  Administered 2016-10-10: 150 mg via INTRAVENOUS
  Administered 2016-10-10: 50 mg via INTRAVENOUS

## 2016-10-10 MED ORDER — MIDAZOLAM HCL 2 MG/2ML IJ SOLN
INTRAMUSCULAR | Status: AC
  Filled 2016-10-10: qty 2

## 2016-10-10 MED ORDER — KETOROLAC TROMETHAMINE 30 MG/ML IJ SOLN
INTRAMUSCULAR | Status: DC | PRN
  Administered 2016-10-10: 30 mg via INTRAVENOUS

## 2016-10-10 MED ORDER — SCOPOLAMINE 1 MG/3DAYS TD PT72: 1.0000 | MEDICATED_PATCH | Freq: Once | TRANSDERMAL | Status: DC | PRN

## 2016-10-10 MED ORDER — LACTATED RINGERS IV SOLN
INTRAVENOUS | Status: DC
  Administered 2016-10-10 (×2): via INTRAVENOUS

## 2016-10-10 MED ORDER — BUPIVACAINE-EPINEPHRINE 0.5% -1:200000 IJ SOLN
INTRAMUSCULAR | Status: DC | PRN
  Administered 2016-10-10: 10 mL

## 2016-10-10 MED ORDER — MIDAZOLAM HCL 5 MG/5ML IJ SOLN
INTRAMUSCULAR | Status: DC | PRN
  Administered 2016-10-10: 2 mg via INTRAVENOUS

## 2016-10-10 MED ORDER — FENTANYL CITRATE (PF) 100 MCG/2ML IJ SOLN
INTRAMUSCULAR | Status: AC
  Filled 2016-10-10: qty 2

## 2016-10-10 MED ORDER — HYDROCODONE-ACETAMINOPHEN 5-325 MG PO TABS: 1.0000 | ORAL_TABLET | Freq: Four times a day (QID) | ORAL | 0 refills | Status: DC | PRN

## 2016-10-10 MED ORDER — DEXAMETHASONE SODIUM PHOSPHATE 10 MG/ML IJ SOLN
INTRAMUSCULAR | Status: AC
  Filled 2016-10-10: qty 1

## 2016-10-10 MED ORDER — ONDANSETRON HCL 4 MG/2ML IJ SOLN
INTRAMUSCULAR | Status: DC | PRN
  Administered 2016-10-10: 4 mg via INTRAVENOUS

## 2016-10-10 MED ORDER — CELECOXIB 200 MG PO CAPS
ORAL_CAPSULE | ORAL | Status: AC
  Filled 2016-10-10: qty 2

## 2016-10-10 MED ORDER — LIDOCAINE 2% (20 MG/ML) 5 ML SYRINGE
INTRAMUSCULAR | Status: AC
  Filled 2016-10-10: qty 5

## 2016-10-10 MED ORDER — KETOROLAC TROMETHAMINE 30 MG/ML IJ SOLN
INTRAMUSCULAR | Status: AC
  Filled 2016-10-10: qty 1

## 2016-10-10 MED ORDER — GABAPENTIN 300 MG PO CAPS
300.0000 mg | ORAL_CAPSULE | ORAL | Status: AC
  Administered 2016-10-10: 300 mg via ORAL

## 2016-10-10 MED ORDER — GLYCOPYRROLATE 0.2 MG/ML IJ SOLN
INTRAMUSCULAR | Status: DC | PRN
  Administered 2016-10-10: 0.2 mg via INTRAVENOUS

## 2016-10-10 MED ORDER — OXYCODONE HCL 5 MG PO TABS: 5.0000 mg | ORAL_TABLET | Freq: Once | ORAL | Status: DC | PRN

## 2016-10-10 MED ORDER — OXYCODONE HCL 5 MG/5ML PO SOLN: 5.0000 mg | Freq: Once | ORAL | Status: DC | PRN

## 2016-10-10 MED ORDER — LIDOCAINE HCL (CARDIAC) 20 MG/ML IV SOLN
INTRAVENOUS | Status: DC | PRN
  Administered 2016-10-10: 30 mg via INTRAVENOUS

## 2016-10-10 MED ORDER — BUPIVACAINE HCL (PF) 0.25 % IJ SOLN
INTRAMUSCULAR | Status: AC
  Filled 2016-10-10: qty 30

## 2016-10-10 MED ORDER — ACETAMINOPHEN 500 MG PO TABS
ORAL_TABLET | ORAL | Status: AC
  Filled 2016-10-10: qty 2

## 2016-10-10 MED ORDER — FENTANYL CITRATE (PF) 100 MCG/2ML IJ SOLN
INTRAMUSCULAR | Status: DC | PRN
  Administered 2016-10-10: 25 ug via INTRAVENOUS
  Administered 2016-10-10: 100 ug via INTRAVENOUS

## 2016-10-10 MED ORDER — CEFAZOLIN SODIUM-DEXTROSE 2-4 GM/100ML-% IV SOLN
2.0000 g | INTRAVENOUS | Status: AC
  Administered 2016-10-10: 2 g via INTRAVENOUS

## 2016-10-10 MED ORDER — MIDAZOLAM HCL 2 MG/2ML IJ SOLN: 1.0000 mg | INTRAMUSCULAR | Status: DC | PRN

## 2016-10-10 MED ORDER — ACETAMINOPHEN 500 MG PO TABS
1000.0000 mg | ORAL_TABLET | ORAL | Status: AC
  Administered 2016-10-10: 1000 mg via ORAL

## 2016-10-10 MED ORDER — BUPIVACAINE-EPINEPHRINE (PF) 0.5% -1:200000 IJ SOLN
INTRAMUSCULAR | Status: AC
Start: 2016-10-10 — End: 2016-10-10
  Filled 2016-10-10: qty 30

## 2016-10-10 MED ORDER — FENTANYL CITRATE (PF) 100 MCG/2ML IJ SOLN: 25.0000 ug | INTRAMUSCULAR | Status: DC | PRN

## 2016-10-10 MED ORDER — EPHEDRINE SULFATE 50 MG/ML IJ SOLN
INTRAMUSCULAR | Status: DC | PRN
  Administered 2016-10-10: 10 mg via INTRAVENOUS

## 2016-10-10 MED ORDER — DEXAMETHASONE SODIUM PHOSPHATE 4 MG/ML IJ SOLN
INTRAMUSCULAR | Status: DC | PRN
  Administered 2016-10-10: 10 mg via INTRAVENOUS

## 2016-10-10 MED ORDER — GABAPENTIN 300 MG PO CAPS
ORAL_CAPSULE | ORAL | Status: AC
  Filled 2016-10-10: qty 1

## 2016-10-10 MED ORDER — ONDANSETRON HCL 4 MG/2ML IJ SOLN
INTRAMUSCULAR | Status: AC
  Filled 2016-10-10: qty 2

## 2016-10-10 MED ORDER — ONDANSETRON HCL 4 MG/2ML IJ SOLN: 4.0000 mg | Freq: Four times a day (QID) | INTRAMUSCULAR | Status: DC | PRN

## 2016-10-10 SURGICAL SUPPLY — 58 items
APPLIER CLIP 9.375 MED OPEN (MISCELLANEOUS) ×3
BENZOIN TINCTURE PRP APPL 2/3 (GAUZE/BANDAGES/DRESSINGS) ×3 IMPLANT
BLADE HEX COATED 2.75 (ELECTRODE) ×3 IMPLANT
BLADE SURG 15 STRL LF DISP TIS (BLADE) ×1 IMPLANT
BLADE SURG 15 STRL SS (BLADE) ×2
CANISTER SUC SOCK COL 7IN (MISCELLANEOUS) IMPLANT
CANISTER SUCT 1200ML W/VALVE (MISCELLANEOUS) ×3 IMPLANT
CHLORAPREP W/TINT 26ML (MISCELLANEOUS) ×3 IMPLANT
CLIP APPLIE 9.375 MED OPEN (MISCELLANEOUS) ×1 IMPLANT
CLOSURE WOUND 1/2 X4 (GAUZE/BANDAGES/DRESSINGS) ×1
COVER BACK TABLE 60X90IN (DRAPES) ×3 IMPLANT
COVER MAYO STAND STRL (DRAPES) ×3 IMPLANT
COVER PROBE W GEL 5X96 (DRAPES) ×3 IMPLANT
DECANTER SPIKE VIAL GLASS SM (MISCELLANEOUS) IMPLANT
DEVICE DUBIN W/COMP PLATE 8390 (MISCELLANEOUS) ×3 IMPLANT
DRAPE LAPAROTOMY 100X72 PEDS (DRAPES) ×3 IMPLANT
DRAPE UTILITY XL STRL (DRAPES) ×3 IMPLANT
DRSG TEGADERM 4X4.75 (GAUZE/BANDAGES/DRESSINGS) ×3 IMPLANT
ELECT BLADE 4.0 EZ CLEAN MEGAD (MISCELLANEOUS) ×3
ELECT REM PT RETURN 9FT ADLT (ELECTROSURGICAL) ×3
ELECTRODE BLDE 4.0 EZ CLN MEGD (MISCELLANEOUS) ×1 IMPLANT
ELECTRODE REM PT RTRN 9FT ADLT (ELECTROSURGICAL) ×1 IMPLANT
GLOVE BIO SURGEON STRL SZ 6.5 (GLOVE) ×2 IMPLANT
GLOVE BIO SURGEON STRL SZ7 (GLOVE) ×3 IMPLANT
GLOVE BIO SURGEONS STRL SZ 6.5 (GLOVE) ×1
GLOVE BIOGEL PI IND STRL 7.0 (GLOVE) ×2 IMPLANT
GLOVE BIOGEL PI IND STRL 7.5 (GLOVE) ×1 IMPLANT
GLOVE BIOGEL PI INDICATOR 7.0 (GLOVE) ×4
GLOVE BIOGEL PI INDICATOR 7.5 (GLOVE) ×2
GLOVE EXAM NITRILE EXT CUFF MD (GLOVE) ×3 IMPLANT
GLOVE SURG SS PI 7.5 STRL IVOR (GLOVE) ×3 IMPLANT
GOWN STRL REUS W/ TWL LRG LVL3 (GOWN DISPOSABLE) ×3 IMPLANT
GOWN STRL REUS W/TWL LRG LVL3 (GOWN DISPOSABLE) ×6
ILLUMINATOR WAVEGUIDE N/F (MISCELLANEOUS) IMPLANT
KIT MARKER MARGIN INK (KITS) ×3 IMPLANT
LIGHT WAVEGUIDE WIDE FLAT (MISCELLANEOUS) IMPLANT
NEEDLE HYPO 25X1 1.5 SAFETY (NEEDLE) ×3 IMPLANT
NS IRRIG 1000ML POUR BTL (IV SOLUTION) ×3 IMPLANT
PACK BASIN DAY SURGERY FS (CUSTOM PROCEDURE TRAY) ×3 IMPLANT
PENCIL BUTTON HOLSTER BLD 10FT (ELECTRODE) ×3 IMPLANT
SLEEVE SCD COMPRESS KNEE MED (MISCELLANEOUS) ×3 IMPLANT
SPONGE GAUZE 2X2 8PLY STER LF (GAUZE/BANDAGES/DRESSINGS)
SPONGE GAUZE 2X2 8PLY STRL LF (GAUZE/BANDAGES/DRESSINGS) IMPLANT
SPONGE GAUZE 4X4 12PLY STER LF (GAUZE/BANDAGES/DRESSINGS) IMPLANT
SPONGE LAP 18X18 X RAY DECT (DISPOSABLE) IMPLANT
SPONGE LAP 4X18 X RAY DECT (DISPOSABLE) ×3 IMPLANT
STRIP CLOSURE SKIN 1/2X4 (GAUZE/BANDAGES/DRESSINGS) ×2 IMPLANT
SUT MON AB 4-0 PC3 18 (SUTURE) ×3 IMPLANT
SUT SILK 2 0 SH (SUTURE) IMPLANT
SUT VIC AB 3-0 SH 27 (SUTURE) ×2
SUT VIC AB 3-0 SH 27X BRD (SUTURE) ×1 IMPLANT
SYR BULB 3OZ (MISCELLANEOUS) IMPLANT
SYR CONTROL 10ML LL (SYRINGE) ×3 IMPLANT
TOWEL OR 17X24 6PK STRL BLUE (TOWEL DISPOSABLE) ×3 IMPLANT
TOWEL OR NON WOVEN STRL DISP B (DISPOSABLE) IMPLANT
TUBE CONNECTING 20'X1/4 (TUBING) ×1
TUBE CONNECTING 20X1/4 (TUBING) ×2 IMPLANT
YANKAUER SUCT BULB TIP NO VENT (SUCTIONS) ×3 IMPLANT

## 2016-10-10 NOTE — Op Note (Signed)
Preop diagnosis: Atypical ductal hyperplasia with microcalcifications-left breast Postop diagnosis: Same Procedure performed: Left radioactive seed localized lumpectomy Surgeon:Sarenity Ramaker K. Anesthesia: Gen. via LMA Indications: This is a 56 year old female who presented after a recent screening mammogram showed an area of asymmetry in the upper outer quadrant of the left breast. Biopsy showed atypical ductal hyperplasia with microcalcifications. A radioactive seed was placed for localization yesterday. She presents now for lumpectomy.  Description of procedure:The patient was brought to the operating room and placed in a supine position on the operating room table. The presence of the seed had been confirmed in the preoperative area using the neoprobe. After an adequate level of general anesthesia was obtained, the patient's left breast was prepped with ChloraPrep and draped sterile fashion. A timeout was taken to ensure the proper patient and proper procedure. The left breast was interrogated with the neoprobe device. The seed is located in the upper outer quadrant about 4 cm away from the edge of the nipple. We infiltrated with 0.5% Marcaine with epinephrine. The made a curvilinear incision around the edge of the areola laterally. We then dissected laterally through the breast tissue until we approached the area of the radioactive seed. We raised margins around this area and dissected tissue off the underlying chest wall. The specimen was removed entirely and oriented with a paint kit. Specimen mammogram showed the presence of the seed as well as the biopsy clip within the specimen. As was sent for pathologic examination. The wound was irrigated and inspected for hemostasis. The wound was closed with 3-0 Vicryl and 4-0 Monocryl. Steri-Strips were applied. A dry dressing was applied. The patient was then extubated and brought to the recovery room in stable condition. All sponge, instrument, and needle  counts are correct.  Imogene Burn. Georgette Dover, MD, [facility]  General/ Trauma Surgery  10/10/2016 10:56 AM

## 2016-10-10 NOTE — Anesthesia Postprocedure Evaluation (Signed)
Anesthesia Post Note  Patient: Alyssa Riley  Procedure(s) Performed: Procedure(s) (LRB): LEFT BREAST LUMPECTOMY WITH RADIOACTIVE SEED LOCALIZATION (Left)  Patient location during evaluation: PACU Anesthesia Type: General Level of consciousness: awake and alert and patient cooperative Pain management: pain level controlled Vital Signs Assessment: post-procedure vital signs reviewed and stable Respiratory status: spontaneous breathing and respiratory function stable Cardiovascular status: stable Anesthetic complications: no       Last Vitals:  Vitals:   10/10/16 1100 10/10/16 1130  BP: 106/61 (!) 112/50  Pulse: (!) 106 87  Resp: 18 16  Temp:  36.5 C    Last Pain:  Vitals:   10/10/16 1130  TempSrc:   PainSc: 0-No pain                 Winson Eichorn S

## 2016-10-10 NOTE — Transfer of Care (Signed)
Immediate Anesthesia Transfer of Care Note  Patient: Alyssa Riley  Procedure(s) Performed: Procedure(s): LEFT BREAST LUMPECTOMY WITH RADIOACTIVE SEED LOCALIZATION (Left)  Patient Location: PACU  Anesthesia Type:General  Level of Consciousness: awake  Airway & Oxygen Therapy: Patient Spontanous Breathing and Patient connected to face mask oxygen  Post-op Assessment: Report given to RN and Post -op Vital signs reviewed and stable  Post vital signs: Reviewed and stable  Last Vitals:  Vitals:   10/10/16 0840  BP: (!) 124/55  Pulse: 84  Resp: 18  Temp: 36.8 C    Last Pain:  Vitals:   10/10/16 0840  TempSrc: Oral         Complications: No apparent anesthesia complications

## 2016-10-10 NOTE — Anesthesia Procedure Notes (Signed)
Procedure Name: LMA Insertion Date/Time: 10/10/2016 9:37 AM Performed by: Toula Moos L Pre-anesthesia Checklist: Patient identified, Emergency Drugs available, Suction available, Patient being monitored and Timeout performed Patient Re-evaluated:Patient Re-evaluated prior to inductionOxygen Delivery Method: Circle system utilized Preoxygenation: Pre-oxygenation with 100% oxygen Intubation Type: IV induction Ventilation: Mask ventilation without difficulty LMA: LMA inserted LMA Size: 4.0 Number of attempts: 1 Airway Equipment and Method: Bite block Placement Confirmation: positive ETCO2 Tube secured with: Tape Dental Injury: Teeth and Oropharynx as per pre-operative assessment

## 2016-10-10 NOTE — H&P (View-Only) (Signed)
History of Present Illness Alyssa Riley. Alyssa Castilleja MD; 09/25/2016 10:46 AM) The patient is a 56 year old female who presents with a breast mass. 56 yo female in good health presents after recent screening mammogram. This showed an area of asymmetry in the upper outer quadrant at posterior depth. Further workup including a core biopsy showed atypical ductal hyperplasia with microcalcifications. A clip was placed. She presents now to discuss surgical options. The patient has no breast complaints prior to her mammogram. She has had no previous history. 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 She was on blood thinners but is no longer on blood thinners. She has been evaluated by Dr. Irene Riley at the cancer center.   Menarche - age 28 First Pregnancy - 45; 2 miscarriages/ 2 live births Breastfeed - yes Hormones - OCP less than 10 years No Family history - adopted Menopause - age 89   CLINICAL DATA: Screening.  EXAM: DIGITAL SCREENING BILATERAL MAMMOGRAM WITH CAD  COMPARISON: Previous exam(s).  ACR Breast Density Category c: The breast tissue is heterogeneously dense, which may obscure small masses.  FINDINGS: In the left breast, a possible asymmetry warrants further evaluation. This possible asymmetry is seen within the outer left breast at posterior depth on the CC view, with possible corresponding distortion seen within the upper left breast on the MLO view.  In the right breast, no findings suspicious for malignancy. Images were processed with CAD.  IMPRESSION: Further evaluation is suggested for possible asymmetry/distortion in the left breast.  RECOMMENDATION: Diagnostic mammogram and possibly ultrasound of the left breast. (Code:[id number])  The patient will be contacted regarding the findings, and additional imaging will be scheduled.  BI-RADS CATEGORY 0: Incomplete. Need additional imaging evaluation and/or prior mammograms for  comparison.   Electronically Signed By: Alyssa Riley M.D. On: 09/01/2016 14:33  CLINICAL DATA: Screening recall for a possible asymmetry in the left breast.  EXAM: 2D DIGITAL DIAGNOSTIC LEFT MAMMOGRAM WITH CAD AND ADJUNCT TOMO  ULTRASOUND LEFT BREAST  COMPARISON: Previous exam(s).  ACR Breast Density Category c: The breast tissue is heterogeneously dense, which may obscure small masses.  FINDINGS: On the diagnostic images, the possible asymmetry disperses. However, there is evidence of architectural distortion in the posterior upper outer quadrant. This is evident on both the CC and MLO views. There are no discrete masses. There are no suspicious calcifications.  Mammographic images were processed with CAD.  On physical exam, no mass is palpated in the upper outer left breast.  Targeted ultrasound is performed, showing no mass or cyst. No sonographic architectural distortion. There is heterogeneous partly shadowing fibroglandular tissue throughout the upper outer quadrant.  IMPRESSION: Architectural distortion in the upper outer aspect of the left breast suspicious for possible malignancy. Biopsy is recommended.  RECOMMENDATION: Affirm stereotactic core needle biopsy. Patient will be scheduled for this procedure.  I have discussed the findings and recommendations with the patient. Results were also provided in writing at the conclusion of the visit. If applicable, a reminder letter will be sent to the patient regarding the next appointment.  BI-RADS CATEGORY 4: Suspicious.   Electronically Signed By: Alyssa Riley M.D. On: 09/11/2016 14:52  CLINICAL DATA: Left breast upper outer quadrant area of distortion.  EXAM: LEFT BREAST STEREOTACTIC CORE NEEDLE BIOPSY  COMPARISON: Previous exams.  FINDINGS: The patient and I discussed the procedure of stereotactic-guided biopsy including benefits and alternatives. We discussed the high likelihood of a  successful procedure. We discussed the risks of  the procedure including infection, bleeding, tissue injury, clip migration, and inadequate sampling. Informed written consent was given. The usual time out protocol was performed immediately prior to the procedure.  Using sterile technique and 1% Lidocaine as local anesthetic, under stereotactic guidance, a 9 gauge vacuum assisted device was used to perform core needle biopsy of left breast upper outer quadrant distortion using a lateral approach. Specimen radiograph was not performed.  At the conclusion of the procedure, a coil shaped tissue marker clip was deployed into the biopsy cavity. Follow-up 2-view mammogram was performed and dictated separately.  IMPRESSION: Stereotactic-guided biopsy of the left breast. No apparent complications.  Electronically Signed: By: Alyssa Riley M.D. On: 09/13/2016 16:35   CLINICAL DATA: Post stereotactic core needle biopsy of the left breast.  EXAM: DIAGNOSTIC LEFT MAMMOGRAM POST STEREOTACTIC BIOPSY  COMPARISON: Previous exam(s).  FINDINGS: Mammographic images were obtained following stereotactic guided biopsy of left breast upper outer quadrant distortion. The coil shaped marker is noted to be 9 mm superior to the geometric center of the asymmetry on the ML view and 1.6 cm anterolateral to the presumed geometric center of the asymmetry on the craniocaudal view.  IMPRESSION: Successful placement of coil shaped tissue marker within left breast upper outer quadrant biopsy site.  Given the appearance of this abnormality, surgical excision may be necessary regardless of the pathology results. These findings and recommendations were discussed with the patient immediately after the procedure. Tomosynthesis guided radioactive seed placement may be necessary prior to the surgical procedure. The geometric center in ML projection may be chosen for seed placement, if the  pathology results from this stereotactic biopsy are benign and nonspecific.  Final Assessment: Post Procedure Mammograms for Marker Placement   Electronically Signed By: Alyssa Riley M.D. On: 09/13/2016 16:54   Diagnosis Breast, left, needle core biopsy, UOQ - ATYPICAL DUCTAL HYPERPLASIA, FOCAL, IN SCLEROSING ADENOSIS WITH CALCIFICATIONS. - FIBROADENOMA. - SEE COMMENT. Microscopic Comment The results were called to [facility] on 09/14/2016. (JBK:ecj 09/14/2016) Enid Cutter MD Pathologist, Electronic Signature (Case signed 09/14/2016   Past Surgical History Malachy Moan, [place]; 09/25/2016 9:56 AM) Breast Biopsy Left. Foot Surgery Left. Oral Surgery  Diagnostic Studies History Malachy Moan, [place]; 09/25/2016 9:56 AM) Colonoscopy never Mammogram within last year Pap Smear 1-5 years ago  Allergies Malachy Moan, RMA; 09/25/2016 9:59 AM) No Known Drug Allergies 09/25/2016  Medication History Malachy Moan, RMA; 09/25/2016 9:59 AM) Alveda Reasons (20MG  Tablet, Oral) Active. Rivaroxaban (15 & 20MG  Tab Ther Pack, Oral) Active. Medications Reconciled  Social History Malachy Moan, [place]; 09/25/2016 9:56 AM) Alcohol use Moderate alcohol use. No caffeine use No drug use Tobacco use Never smoker.  Family History Malachy Moan, [place]; 09/25/2016 9:56 AM) Family history unknown First Degree Relatives  Pregnancy / Birth History Malachy Moan, [place]; 09/25/2016 9:56 AM) Age at menarche 55 years. Age of menopause 56-50 Contraceptive History Oral contraceptives. Gravida 4 Length (months) of breastfeeding 3-6 Maternal age 46-35 Para 2  Other Problems Malachy Moan, [place]; 09/25/2016 9:56 AM) No pertinent past medical history     Review of Systems Malachy Moan RMA; 09/25/2016 9:56 AM) General Not Present- Appetite Loss, Chills, Fatigue, Fever, Night Sweats, Weight Gain and Weight Loss. Skin Not Present-  Change in Wart/Mole, Dryness, Hives, Jaundice, New Lesions, Non-Healing Wounds, Rash and Ulcer. HEENT Present- Wears glasses/contact lenses. Not Present- Earache, Hearing Loss, Hoarseness, Nose Bleed, Oral Ulcers, Ringing in the Ears, Seasonal Allergies, Sinus Pain, Sore Throat, Visual Disturbances and Yellow Eyes. Respiratory Not Present- Bloody sputum,  Chronic Cough, Difficulty Breathing, Snoring and Wheezing. Breast Not Present- Breast Mass, Breast Pain, Nipple Discharge and Skin Changes. Cardiovascular Not Present- Chest Pain, Difficulty Breathing Lying Down, Leg Cramps, Palpitations, Rapid Heart Rate, Shortness of Breath and Swelling of Extremities. Gastrointestinal Not Present- Abdominal Pain, Bloating, Bloody Stool, Change in Bowel Habits, Chronic diarrhea, Constipation, Difficulty Swallowing, Excessive gas, Gets full quickly at meals, Hemorrhoids, Indigestion, Nausea, Rectal Pain and Vomiting. Female Genitourinary Not Present- Frequency, Nocturia, Painful Urination, Pelvic Pain and Urgency. Musculoskeletal Not Present- Back Pain, Joint Pain, Joint Stiffness, Muscle Pain, Muscle Weakness and Swelling of Extremities. Neurological Not Present- Decreased Memory, Fainting, Headaches, Numbness, Seizures, Tingling, Tremor, Trouble walking and Weakness. Psychiatric Not Present- Anxiety, Bipolar, Change in Sleep Pattern, Depression, Fearful and Frequent crying. Endocrine Not Present- Cold Intolerance, Excessive Hunger, Hair Changes, Heat Intolerance, Hot flashes and New Diabetes. Hematology Not Present- Blood Thinners, Easy Bruising, Excessive bleeding, Gland problems, HIV and Persistent Infections.  Vitals Malachy Moan RMA; 09/25/2016 10:00 AM) 09/25/2016 9:59 AM Weight: 146.4 lb Height: 67in Body Surface Area: 1.77 m Body Mass Index: 22.93 kg/m  Temp.: 14F  Pulse: 68 (Regular)  BP: 100/60 (Sitting, Left Arm, Standard)      Physical Exam Rodman Key K. Gwenivere Hiraldo MD; 09/25/2016 10:47  AM)  The physical exam findings are as follows: Note:WDWN in NAD Eyes: Pupils equal, round; sclera anicteric HENT: Oral mucosa moist; good dentition Neck: No masses palpated, no thyromegaly Lungs: CTA bilaterally; normal respiratory effort Breasts: symmetric; no nipple retraction or discharge; no axillary lymphadenopathy; no palpable masses on either side; healing left upper outer quadrant biopsy site CV: Regular rate and rhythm; no murmurs; extremities well-perfused with no edema Abd: +bowel sounds, soft, non-tender, no palpable organomegaly; no palpable hernias Skin: Warm, dry; no sign of jaundice Psychiatric - alert and oriented x 4; calm mood and affect    Assessment & Plan Rodman Key K. Glynis Hunsucker MD; 09/25/2016 10:48 AM)  ATYPICAL DUCTAL HYPERPLASIA OF LEFT BREAST (N60.92)  Current Plans Schedule for Surgery - Left radioactive seed localized lumpectomy. The surgical procedure has been discussed with the patient. Potential risks, benefits, alternative treatments, and expected outcomes have been explained. All of the patient's questions at this time have been answered. The likelihood of reaching the patient's treatment goal is good. The patient understand the proposed surgical procedure and wishes to proceed. Note:I spent about 25 minutes with the patient and her husband discussing the options for treatment. She had a lot of questions, especially about the downtime after surgery. I answered all of her questions and they wish to proceed.  Alyssa Riley. Georgette Dover, MD, [facility] Surgery  General/ Trauma Surgery  09/25/2016 10:49 AM

## 2016-10-10 NOTE — Interval H&P Note (Signed)
History and Physical Interval Note:  10/10/2016 8:58 AM  Alyssa Riley  has presented today for surgery, with the diagnosis of LEFT BREAST ADH  The various methods of treatment have been discussed with the patient and family. After consideration of risks, benefits and other options for treatment, the patient has consented to  Procedure(s): LEFT BREAST LUMPECTOMY WITH RADIOACTIVE SEED LOCALIZATION (Left) as a surgical intervention .  The patient's history has been reviewed, patient examined, no change in status, stable for surgery.  I have reviewed the patient's chart and labs.  Questions were answered to the patient's satisfaction.     Elior Robinette K.

## 2016-10-10 NOTE — Anesthesia Preprocedure Evaluation (Signed)
Anesthesia Evaluation  Patient identified by MRN, date of birth, ID band Patient awake    Reviewed: Allergy & Precautions, H&P , NPO status , Patient's Chart, lab work & pertinent test results  Airway Mallampati: II   Neck ROM: full    Dental   Pulmonary neg pulmonary ROS,    breath sounds clear to auscultation       Cardiovascular + DVT   Rhythm:regular Rate:Normal     Neuro/Psych    GI/Hepatic   Endo/Other    Renal/GU      Musculoskeletal   Abdominal   Peds  Hematology  (+) Blood dyscrasia, , Factor 5 Leiden   Anesthesia Other Findings   Reproductive/Obstetrics                             Anesthesia Physical Anesthesia Plan  ASA: II  Anesthesia Plan: General   Post-op Pain Management:    Induction: Intravenous  Airway Management Planned: LMA  Additional Equipment:   Intra-op Plan:   Post-operative Plan:   Informed Consent: I have reviewed the patients History and Physical, chart, labs and discussed the procedure including the risks, benefits and alternatives for the proposed anesthesia with the patient or authorized representative who has indicated his/her understanding and acceptance.     Plan Discussed with: CRNA, Anesthesiologist and Surgeon  Anesthesia Plan Comments:         Anesthesia Quick Evaluation

## 2016-10-10 NOTE — Discharge Instructions (Signed)
[facility] °Office Phone Number [phone] ° °BREAST BIOPSY/ PARTIAL MASTECTOMY: POST OP INSTRUCTIONS ° °Always review your discharge instruction sheet given to you by the facility where your surgery was performed. ° °IF YOU HAVE DISABILITY OR FAMILY LEAVE FORMS, YOU MUST BRING THEM TO THE OFFICE FOR PROCESSING.  DO NOT GIVE THEM TO YOUR DOCTOR. ° °1. A prescription for pain medication may be given to you upon discharge.  Take your pain medication as prescribed, if needed.  If narcotic pain medicine is not needed, then you may take acetaminophen (Tylenol) or ibuprofen (Advil) as needed. °2. Take your usually prescribed medications unless otherwise directed °3. If you need a refill on your pain medication, please contact your pharmacy.  They will contact our office to request authorization.  Prescriptions will not be filled after 5pm or on week-ends. °4. You should eat very light the first 24 hours after surgery, such as soup, crackers, pudding, etc.  Resume your normal diet the day after surgery. °5. Most patients will experience some swelling and bruising in the breast.  Ice packs and a good support bra will help.  Swelling and bruising can take several days to resolve.  °6. It is common to experience some constipation if taking pain medication after surgery.  Increasing fluid intake and taking a stool softener will usually help or prevent this problem from occurring.  A mild laxative (Milk of Magnesia or Miralax) should be taken according to package directions if there are no bowel movements after 48 hours. °7. Unless discharge instructions indicate otherwise, you may remove your bandages 24-48 hours after surgery, and you may shower at that time.  You may have steri-strips (small skin tapes) in place directly over the incision.  These strips should be left on the skin for 7-10 days.  If your surgeon used skin glue on the incision, you may shower in 24 hours.  The glue will flake off over the  next 2-3 weeks.  Any sutures or staples will be removed at the office during your follow-up visit. °8. ACTIVITIES:  You may resume regular daily activities (gradually increasing) beginning the next day.  Wearing a good support bra or sports bra minimizes pain and swelling.  You may have sexual intercourse when it is comfortable. °a. You may drive when you no longer are taking prescription pain medication, you can comfortably wear a seatbelt, and you can safely maneuver your car and apply brakes. °b. RETURN TO WORK:  ______________________________________________________________________________________ °9. You should see your doctor in the office for a follow-up appointment approximately two weeks after your surgery.  Your doctor’s nurse will typically make your follow-up appointment when she calls you with your pathology report.  Expect your pathology report 2-3 business days after your surgery.  You may call to check if you do not hear from us after three days. °10. OTHER INSTRUCTIONS: _______________________________________________________________________________________________ _____________________________________________________________________________________________________________________________________ °_____________________________________________________________________________________________________________________________________ °_____________________________________________________________________________________________________________________________________ ° °WHEN TO CALL YOUR DOCTOR: °1. Fever over 101.0 °2. Nausea and/or vomiting. °3. Extreme swelling or bruising. °4. Continued bleeding from incision. °5. Increased pain, redness, or drainage from the incision. ° °The clinic staff is available to answer your questions during regular business hours.  Please don’t hesitate to call and ask to speak to one of the nurses for clinical concerns.  If you have a medical emergency, go to the nearest  emergency room or call 911.  A surgeon from [facility] is always on call at the hospital. ° °For further questions, please visit [email or web address]  ° ° ° °  Post Anesthesia Home Care Instructions ° °Activity: °Get plenty of rest for the remainder of the day. A responsible adult should stay with you for 24 hours following the procedure.  °For the next 24 hours, DO NOT: °-Drive a car °-Operate machinery °-Drink alcoholic beverages °-Take any medication unless instructed by your physician °-Make any legal decisions or sign important papers. ° °Meals: °Start with liquid foods such as gelatin or soup. Progress to regular foods as tolerated. Avoid greasy, spicy, heavy foods. If nausea and/or vomiting occur, drink only clear liquids until the nausea and/or vomiting subsides. Call your physician if vomiting continues. ° °Special Instructions/Symptoms: °Your throat may feel dry or sore from the anesthesia or the breathing tube placed in your throat during surgery. If this causes discomfort, gargle with warm salt water. The discomfort should disappear within 24 hours. ° °If you had a scopolamine patch placed behind your ear for the management of post- operative nausea and/or vomiting: ° °1. The medication in the patch is effective for 72 hours, after which it should be removed.  Wrap patch in a tissue and discard in the trash. Wash hands thoroughly with soap and water. °2. You may remove the patch earlier than 72 hours if you experience unpleasant side effects which may include dry mouth, dizziness or visual disturbances. °3. Avoid touching the patch. Wash your hands with soap and water after contact with the patch. °  ° ° °Call your surgeon if you experience:  ° °1.  Fever over 101.0. °2.  Inability to urinate. °3.  Nausea and/or vomiting. °4.  Extreme swelling or bruising at the surgical site. °5.  Continued bleeding from the incision. °6.  Increased pain, redness or drainage from the incision. °7.   Problems related to your pain medication. °8.  Any problems and/or concerns °

## 2016-10-11 ENCOUNTER — Encounter (HOSPITAL_BASED_OUTPATIENT_CLINIC_OR_DEPARTMENT_OTHER): Payer: Self-pay | Admitting: Surgery

## 2016-10-12 ENCOUNTER — Encounter: Payer: Self-pay | Admitting: Radiation Oncology

## 2016-10-16 ENCOUNTER — Other Ambulatory Visit: Payer: Self-pay | Admitting: *Deleted

## 2016-10-16 DIAGNOSIS — D6851 Activated protein C resistance: Secondary | ICD-10-CM

## 2016-10-18 ENCOUNTER — Telehealth: Payer: Self-pay | Admitting: Hematology

## 2016-10-18 ENCOUNTER — Encounter: Payer: Self-pay | Admitting: Radiation Oncology

## 2016-10-18 ENCOUNTER — Telehealth: Payer: Self-pay | Admitting: Hematology and Oncology

## 2016-10-18 NOTE — Telephone Encounter (Signed)
Pt cld stating she would like to transfer MDs. Msg was sent to Dr. Lindi Adie and Irene Limbo to see if the transfer was ok. Appt scheduled on 2/26 at 345pm w/[place]. Aware to arrive 30 minutes early.

## 2016-10-18 NOTE — Progress Notes (Signed)
Location of Breast Cancer:Left Breast Upper Outer Quadrant  Histology per Pathology Report:1/10/218:  Breast, left, needle core biopsy, UOQ - ATYPICAL DUCTAL HYPERPLASIA, FOCAL, IN SCLEROSING ADENOSIS WITH CALCIFICATIONS. - FIBROADENOMA. - SEE COMMENT.   Receptor Status: ER(), PR (), Her2-neu (), Ki-()  Did patient present with symptoms (if so, please note symptoms) or was this found on screening mammography?: Routine screening  Past/Anticipated interventions by surgeon, if [id number] 2/6/218: Dr. Dr. Donnie Mesa, MD radioactive seed localized lumpectomy: Follow up next Monday 10/30/16 Breast, lumpectomy, Left - DUCTAL CARCINOMA IN SITU WITH CALCIFICATIONS, INTERMEDIATE GRADE, SPANNING AT LEAST 1.5 CM. - DUCTAL CARCINOMA IN SITU IS BROADLY 0.1 CM TO THE POSTERIOR MARGIN.  Past/Anticipated interventions by medical oncology, if any: Chemotherapy : Dr. Irene Limbo, MD seen 1/4/218  Lymphedema issues, if any:   NO  Pain issues, if any:  Soreness left breast   SAFETY ISSUES:  Prior radiation? NO  Pacemaker/ICD? NO  Possible current pregnancy? NO Is the patient on methotrexate? NO Current Complaints / other details: Menarche age 32, G34P2, 2 miscarriages, Hormone OCP less than 10 years,, , hx DVT secondary to factor V Leiden deficiency  non tobacco use, social alcohol use, no drug use,  No family hx, patient was adopted BP (!) 112/53 (BP Location: Right Arm, Patient Position: Sitting, Cuff Size: Normal)   Pulse 77   Temp 98.2 F (36.8 C) (Oral)   Resp 16   Ht 5' 7"  (1.702 m)   Wt 145 lb 6.4 oz (66 kg)   BMI 22.77 kg/m   Wt Readings from Last 3 Encounters:  10/23/16 145 lb 6.4 oz (66 kg)  10/10/16 143 lb 4.8 oz (65 kg)  09/07/16 149 lb (67.6 kg)   Rebecca Eaton, RN 10/18/2016,10:59 AM

## 2016-10-18 NOTE — Telephone Encounter (Signed)
lvm to inform pt of 2/28 appts at 330 pm per lOS

## 2016-10-23 ENCOUNTER — Ambulatory Visit
Admission: RE | Admit: 2016-10-23 | Discharge: 2016-10-23 | Disposition: A | Discharge status: Home / Self Care | Payer: Self-pay | Source: Ambulatory Visit | Attending: Radiation Oncology | Admitting: Radiation Oncology

## 2016-10-23 ENCOUNTER — Encounter: Payer: Self-pay | Admitting: Radiation Oncology

## 2016-10-23 DIAGNOSIS — Z510 Encounter for antineoplastic radiation therapy: Secondary | ICD-10-CM | POA: Insufficient documentation

## 2016-10-23 DIAGNOSIS — Z86718 Personal history of other venous thrombosis and embolism: Secondary | ICD-10-CM | POA: Insufficient documentation

## 2016-10-23 DIAGNOSIS — C50412 Malignant neoplasm of upper-outer quadrant of left female breast: Secondary | ICD-10-CM

## 2016-10-23 DIAGNOSIS — Z171 Estrogen receptor negative status [ER-]: Secondary | ICD-10-CM | POA: Insufficient documentation

## 2016-10-23 DIAGNOSIS — D6851 Activated protein C resistance: Secondary | ICD-10-CM | POA: Insufficient documentation

## 2016-10-23 DIAGNOSIS — D0592 Unspecified type of carcinoma in situ of left breast: Secondary | ICD-10-CM | POA: Insufficient documentation

## 2016-10-23 HISTORY — DX: Malignant neoplasm of unspecified site of unspecified female breast: C50.919

## 2016-10-23 NOTE — Progress Notes (Signed)
Radiation Oncology         [phone] ________________________________  Name: Alyssa Riley MRN: [id number]  Date: 10/23/2016  DOB: 25-Jul-1961  CC:No primary care provider on file.  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   Cancer Staging DCIS left breast; ER negative Staging form: Breast, AJCC 8th Edition - Pathologic: Stage Unknown (pTis (DCIS), pNX, cM0, G2, ER: Negative, PR: Negative, HER2: Not Assessed) - Signed by Kyung Rudd, MD on 10/24/2016     HISTORY OF PRESENT ILLNESS: Alyssa Riley is a 56 y.o. female seen at the request of Dr. Georgette Dover. The patient originally presented for screening mammogram on 08/31/2016 where she was found to have possible asymmetry/distortion in the left breast which warranted further evaluation. A diagnostic mammogram and ultrasound of the left breast were performed on 09/11/2016. These scans showed architectural distortion in the upper outer aspect of the left breast suspicious for possible malignancy.   Biopsy of the upper outer quadrant of the left breast on 09/13/2016 revealed atypical ductal hyperplasia, focal, in sclerosing adenosis with calcifications. Fibroadenoma also noted.   The patient underwent left breast lumpectomy on 10/10/2016 with Dr. Georgette Dover. Final pathology showed DCIS with calcifications, intermediate grade, ER 0% / PR 0%, spanning at least 1.5 cm. DCIS is broadly 0.1 cm to the posterior margin.   She previously met with medical oncologist Dr. Irene Limbo on 09/07/16 for Factor V leiden mutation. She is scheduled to meet with Dr. Lindi Adie for new breast cancer diagnosis next Monday, 10/30/16.  The patient is here for further evaluation and discussion of the role for radiation therapy in the management of her disease.    PREVIOUS RADIATION THERAPY: No   PAST MEDICAL HISTORY:  Past Medical History:  Diagnosis Date  . Breast cancer ([place]) 09/13/2016    left breast   . DVT (deep venous thrombosis) ([place]) 06/2016   Left calf  . Factor 5 Leiden mutation, heterozygous ([place])        PAST SURGICAL HISTORY: Past Surgical History:  Procedure Laterality Date  . BREAST LUMPECTOMY WITH RADIOACTIVE SEED LOCALIZATION Left 10/10/2016   Procedure: LEFT BREAST LUMPECTOMY WITH RADIOACTIVE SEED LOCALIZATION;  Surgeon: Donnie Mesa, MD;  Location: [facility];  Service: General;  Laterality: Left;  . DILATION AND CURETTAGE OF UTERUS  1998  . FRACTURE SURGERY Left    foot at age 57     FAMILY HISTORY: History reviewed. No pertinent family history.   SOCIAL HISTORY:  reports that she has never smoked. She has never used smokeless tobacco. She reports that she drinks alcohol. She reports that she does not use drugs.   ALLERGIES: Patient has no known allergies.   MEDICATIONS:  Current Outpatient Prescriptions  Medication Sig Dispense Refill  . b complex vitamins tablet Take 1 tablet by mouth daily.    . Calcium Carbonate-Vit D-Min (CALCIUM 1200 PO) Take by mouth.    Marland Kitchen HYDROcodone-acetaminophen (NORCO/VICODIN) 5-325 MG tablet Take 1-2 tablets by mouth every 6 (six) hours as needed for moderate pain. 12 tablet 0  . Multiple Vitamin (MULTIVITAMIN) capsule Take 1 capsule by mouth daily.    . vitamin C (ASCORBIC ACID) 500 MG tablet Take 500 mg by mouth daily.     No current facility-administered medications for this encounter.      REVIEW OF SYSTEMS: On review of systems, the patient reports that she is doing well overall. She  denies any chest pain, shortness of breath, cough, fevers, chills, night sweats, unintended weight changes. She denies any bowel or bladder disturbances, and denies abdominal pain, nausea or vomiting. She denies lymphedema. She reports soreness of the left breast. She is active and plays tennis. A complete review of systems is obtained and is otherwise negative.   Menarche age 75.  G4P2, 2 miscarriages. Hormone  OCP less than 10 years. History of DVT secondary to factor V Leiden deficiency.  No tobacco use. Social alcohol use. No drug use. No family history - patient was adopted.  PHYSICAL EXAM:  Wt Readings from Last 3 Encounters:  10/23/16 145 lb 6.4 oz (66 kg)  10/10/16 143 lb 4.8 oz (65 kg)  09/07/16 149 lb (67.6 kg)   Temp Readings from Last 3 Encounters:  10/23/16 98.2 F (36.8 C) (Oral)  10/10/16 97.7 F (36.5 C)  09/07/16 98.7 F (37.1 C) (Oral)   BP Readings from Last 3 Encounters:  10/23/16 (!) 112/53  10/10/16 (!) 112/50  09/07/16 102/61   Pulse Readings from Last 3 Encounters:  10/23/16 77  10/10/16 87  09/07/16 78   Pain Assessment Pain Score: 2  (left soreness) Pain Loc: Breast  In general this is a well appearing caucasian in no acute distress. She is alert and oriented x4 and appropriate throughout the examination. HEENT reveals that the patient is normocephalic, atraumatic. EOMs are intact. PERRLA. Skin is intact without any evidence of gross lesions. Cardiovascular exam reveals a regular rate and rhythm, no clicks rubs or murmurs are auscultated. Chest is clear to auscultation bilaterally. Lymphatic assessment is performed and does not reveal any adenopathy in the cervical, supraclavicular, axillary, or inguinal chains. Abdomen has active bowel sounds in all quadrants and is intact. The abdomen is soft, non tender, non distended. Lower extremities are negative for pretibial pitting edema, deep calf tenderness, cyanosis or clubbing.   ECOG = 0  0 - Asymptomatic (Fully active, able to carry on all predisease activities without restriction)  1 - Symptomatic but completely ambulatory (Restricted in physically strenuous activity but ambulatory and able to carry out work of a light or sedentary nature. For example, light housework, office work)  2 - Symptomatic, <50% in bed during the day (Ambulatory and capable of all self care but unable to carry out any work  activities. Up and about more than 50% of waking hours)  3 - Symptomatic, >50% in bed, but not bedbound (Capable of only limited self-care, confined to bed or chair 50% or more of waking hours)  4 - Bedbound (Completely disabled. Cannot carry on any self-care. Totally confined to bed or chair)  5 - Death   Eustace Pen MM, Creech RH, Tormey DC, et al. [phone]). "Toxicity and response criteria of the [facility]". [place] Oncol. 5 (6): 649-55    LABORATORY DATA:  Lab Results  Component Value Date   HGB 12.6 06/18/2016   HCT 37.0 06/18/2016   Lab Results  Component Value Date   NA 138 06/18/2016   K 4.0 06/18/2016   CL 102 06/18/2016   No results found for: ALT, AST, GGT, ALKPHOS, BILITOT    RADIOGRAPHY: Mm Breast Surgical Specimen  Result Date: 10/10/2016 CLINICAL DATA:  Status post excision for atypical ductal hyperplasia of the left breast after radioactive seed localization. EXAM: SPECIMEN RADIOGRAPH OF THE LEFT BREAST COMPARISON:  Previous exam(s). FINDINGS: Status post excision of the left breast. The radioactive seed and biopsy marker clip are present,  completely intact, and were marked for pathology. IMPRESSION: Specimen radiograph of the left breast. Electronically Signed   By: Nolon Nations M.D.   On: 10/10/2016 10:27   Mm Lt Radioactive Seed Loc Mammo Guide  Result Date: 10/09/2016 CLINICAL DATA:  Patient for preoperative localization for left breast excision. EXAM: MAMMOGRAPHIC GUIDED RADIOACTIVE SEED LOCALIZATION OF THE LEFT BREAST COMPARISON:  Previous exam(s). FINDINGS: Patient presents for radioactive seed localization prior to left breast surgical excision. I met with the patient and we discussed the procedure of seed localization including benefits and alternatives. We discussed the high likelihood of a successful procedure. We discussed the risks of the procedure including infection, bleeding, tissue injury and further surgery. We discussed the low  dose of radioactivity involved in the procedure. Informed, written consent was given. The usual time-out protocol was performed immediately prior to the procedure. Using mammographic guidance, sterile technique, 1% lidocaine and an I-125 radioactive seed, distortion and biopsy marking clip were localized using a lateral approach. Note the radioactive seed is located slightly inferior and posterior to the biopsy marking clip, at the location of distortion. The follow-up mammogram images confirm the seed in the expected location and were marked for Dr. Georgette Dover. Follow-up survey of the patient confirms presence of the radioactive seed. Order number of I-125 seed:  [id number]. Total activity:  8.250 millicurie  Reference Date: 09/29/2016 The patient tolerated the procedure well and was released from the [place]. She was given instructions regarding seed removal. IMPRESSION: Radioactive seed localization left breast. No apparent complications. Electronically Signed   By: Lovey Newcomer M.D.   On: 10/09/2016 15:04       IMPRESSION: The patient has a recent diagnosis of DCIS of the left upper outer breast. She was felt to be a good candidate for breast conservation treatment and she proceeded with a lumpectomy on 10/10/2016. Negative margins were achieved with a close margin posteriorly with a lumpectomy resection taken down to the muscle at this site. No need for further excision. The patient is a good candidate for adjuvant radiation treatment at this time. She also has an appointment with medical oncology scheduled.  I discussed with the patient the role of adjuvant radiation treatment in this setting. We discussed the potential benefit of radiation treatment, especially with regards to local control of the patient's tumor. We also discussed the possible side effects and risks of such a treatment as well.  All of the patient's questions were answered. The patient wishes to proceed with radiation  treatment.  PLAN: The patient will be scheduled for a simulation in the near future such that we can proceed with treatment planning. I anticipate treating the patient to a dose of 64.4 Gy over 6 1/2 weeks.   ------------------------------------------------  Jodelle Gross, MD, PhD   This document serves as a record of services personally performed by Kyung Rudd, MD. It was created on his behalf by Arlyce Harman, a trained medical scribe. The creation of this record is based on the scribe's personal observations and the provider's statements to them. This document has been checked and approved by the attending provider.

## 2016-10-24 DIAGNOSIS — Z171 Estrogen receptor negative status [ER-]: Secondary | ICD-10-CM

## 2016-10-24 DIAGNOSIS — C50412 Malignant neoplasm of upper-outer quadrant of left female breast: Secondary | ICD-10-CM | POA: Insufficient documentation

## 2016-10-30 ENCOUNTER — Encounter: Payer: Self-pay | Admitting: Hematology and Oncology

## 2016-10-30 ENCOUNTER — Ambulatory Visit (HOSPITAL_BASED_OUTPATIENT_CLINIC_OR_DEPARTMENT_OTHER): Payer: Self-pay | Admitting: Hematology and Oncology

## 2016-10-30 DIAGNOSIS — Z171 Estrogen receptor negative status [ER-]: Secondary | ICD-10-CM

## 2016-10-30 DIAGNOSIS — D0512 Intraductal carcinoma in situ of left breast: Secondary | ICD-10-CM

## 2016-10-30 DIAGNOSIS — C50412 Malignant neoplasm of upper-outer quadrant of left female breast: Secondary | ICD-10-CM

## 2016-10-30 NOTE — Progress Notes (Signed)
[facility] CONSULT NOTE  CHIEF COMPLAINTS/PURPOSE OF CONSULTATION:  Newly diagnosed left breast DCIS  HISTORY OF PRESENTING ILLNESS:  Alyssa Riley 56 y.o. female is here because of recent diagnosis of left breast DCIS. Patient had a routine screening mammogram the detected calcifications which were then evaluated by ultrasound and a biopsy was performed. Biopsy revealed atypical ductal hyperplasia sclerosing adenosis. She subsequently underwent left lumpectomy in 10/10/2016. Lumpectomy revealed DCIS with calcifications intermediate grade and it was ER/PR negative. We discussed her case in the multidisciplinary tumor board and she is here today to discuss adjuvant treatment plan. She is here today accompanied by her husband.  I reviewed her records extensively and collaborated the history with the patient.  SUMMARY OF ONCOLOGIC HISTORY:   DCIS left breast; ER negative   09/13/2016 Initial Diagnosis    Left breast biopsy UOQ: Atypical ductal hyperplasia focal in sclerosing adenosis with calcifications, fibroadenoma      10/10/2016 Surgery    Left lumpectomy: DCIS with calcifications, intermediate grade, 1.5 cm, 0.1 cm to posterior margin, ER 0%, PR 0%, Tis Nx stage 0       MEDICAL HISTORY:  Past Medical History:  Diagnosis Date  . Breast cancer ([place]) 09/13/2016   left breast   . DVT (deep venous thrombosis) ([place]) 06/2016   Left calf  . Factor 5 Leiden mutation, heterozygous [facility])     SURGICAL HISTORY: Past Surgical History:  Procedure Laterality Date  . BREAST LUMPECTOMY WITH RADIOACTIVE SEED LOCALIZATION Left 10/10/2016   Procedure: LEFT BREAST LUMPECTOMY WITH RADIOACTIVE SEED LOCALIZATION;  Surgeon: Donnie Mesa, MD;  Location: [facility];  Service: General;  Laterality: Left;  . DILATION AND CURETTAGE OF UTERUS  1998  . FRACTURE SURGERY Left    foot at age 75    SOCIAL HISTORY: Social History   Social History  . Marital status: Married   Spouse name: N/A  . Number of children: N/A  . Years of education: N/A   Occupational History  . Not on file.   Social History Main Topics  . Smoking status: Never Smoker  . Smokeless tobacco: Never Used  . Alcohol use Yes     Comment: social   . Drug use: No  . Sexual activity: Not on file   Other Topics Concern  . Not on file   Social History Narrative  . No narrative on file    FAMILY HISTORY: Patient is adopted  ALLERGIES:  has No Known Allergies.  MEDICATIONS:  Current Outpatient Prescriptions  Medication Sig Dispense Refill  . b complex vitamins tablet Take 1 tablet by mouth daily.    . Calcium Carbonate-Vit D-Min (CALCIUM 1200 PO) Take by mouth.    Marland Kitchen HYDROcodone-acetaminophen (NORCO/VICODIN) 5-325 MG tablet Take 1-2 tablets by mouth every 6 (six) hours as needed for moderate pain. 12 tablet 0  . Multiple Vitamin (MULTIVITAMIN) capsule Take 1 capsule by mouth daily.    . vitamin C (ASCORBIC ACID) 500 MG tablet Take 500 mg by mouth daily.     No current facility-administered medications for this visit.     REVIEW OF SYSTEMS:   Constitutional: Denies fevers, chills or abnormal night sweats Eyes: Denies blurriness of vision, double vision or watery eyes Ears, nose, mouth, throat, and face: Denies mucositis or sore throat Respiratory: Denies cough, dyspnea or wheezes Cardiovascular: Denies palpitation, chest discomfort or lower extremity swelling Gastrointestinal:  Denies nausea, heartburn or change in bowel habits Skin: Denies abnormal skin rashes Lymphatics: Denies  new lymphadenopathy or easy bruising Neurological:Denies numbness, tingling or new weaknesses Behavioral/Psych: Mood is stable, no new changes  Breast: Recent lumpectomy All other systems were reviewed with the patient and are negative.  PHYSICAL EXAMINATION: ECOG PERFORMANCE STATUS: 1 - Symptomatic but completely ambulatory  Vitals:   10/30/16 1534  BP: 116/64  Pulse: 86  Resp: 17  Temp:  97.5 F (36.4 C)   Filed Weights   10/30/16 1534  Weight: 144 lb 6.4 oz (65.5 kg)    GENERAL:alert, no distress and comfortable SKIN: skin color, texture, turgor are normal, no rashes or significant lesions EYES: normal, conjunctiva are pink and non-injected, sclera clear OROPHARYNX:no exudate, no erythema and lips, buccal mucosa, and tongue normal  NECK: supple, thyroid normal size, non-tender, without nodularity LYMPH:  no palpable lymphadenopathy in the cervical, axillary or inguinal LUNGS: clear to auscultation and percussion with normal breathing effort HEART: regular rate & rhythm and no murmurs and no lower extremity edema ABDOMEN:abdomen soft, non-tender and normal bowel sounds Musculoskeletal:no cyanosis of digits and no clubbing  PSYCH: alert & oriented x 3 with fluent speech NEURO: no focal motor/sensory deficits  LABORATORY DATA:  I have reviewed the data as listed Lab Results  Component Value Date   HGB 12.6 06/18/2016   HCT 37.0 06/18/2016   Lab Results  Component Value Date   NA 138 06/18/2016   K 4.0 06/18/2016   CL 102 06/18/2016    RADIOGRAPHIC STUDIES: I have personally reviewed the radiological reports and agreed with the findings in the report.  ASSESSMENT AND PLAN:  DCIS left breast; ER negative Left lumpectomy 10/10/2016: DCIS with calcifications, intermediate grade, 1.5 cm, 0.1 cm to posterior margin, ER 0%, PR 0%, Tis Nx stage 0  Pathology review: I discussed with the patient the difference between DCIS and invasive breast cancer. It is considered a precancerous lesion. DCIS is classified as a 0. It is generally detected through mammograms as calcifications. We discussed the significance of grades and its impact on prognosis. We also discussed the importance of ER and PR receptors and their implications to adjuvant treatment options. Prognosis of DCIS dependence on grade, comedo necrosis.  Recommendation:adjuvant radiation therapy followed by  surveillance I did not recommend antiestrogen therapy with tamoxifen because it is ER negative.  Patient will follow with her surgeon for her breast exams and mammograms. we're happy to see her back on an as-needed basis.   All questions were answered. The patient knows to call the clinic with any problems, questions or concerns.    Rulon Eisenmenger, MD 10/30/16

## 2016-10-30 NOTE — Assessment & Plan Note (Signed)
Left lumpectomy 10/10/2016: DCIS with calcifications, intermediate grade, 1.5 cm, 0.1 cm to posterior margin, ER 0%, PR 0%, Tis Nx stage 0  Pathology review: I discussed with the patient the difference between DCIS and invasive breast cancer. It is considered a precancerous lesion. DCIS is classified as a 0. It is generally detected through mammograms as calcifications. We discussed the significance of grades and its impact on prognosis. We also discussed the importance of ER and PR receptors and their implications to adjuvant treatment options. Prognosis of DCIS dependence on grade, comedo necrosis.  Recommendation:adjuvant radiation therapy followed by surveillance I did not recommend antiestrogen therapy with tamoxifen because it is ER negative.  Patient wishes to follow with her surgeon for her breast exams and mammograms. You're happy to see her back on an as-needed basis.

## 2016-10-31 ENCOUNTER — Telehealth: Payer: Self-pay | Admitting: *Deleted

## 2016-10-31 NOTE — Addendum Note (Signed)
Encounter addended by: Doreen Beam, RN on: 10/31/2016 10:04 AM<BR>    Actions taken: Charge Capture section accepted

## 2016-10-31 NOTE — Telephone Encounter (Signed)
  Oncology Nurse Navigator Documentation  Navigator Location: [facility] (10/31/16 1300) Referral date to RadOnc/MedOnc: 10/18/16 (10/31/16 1300) )Navigator Encounter Type: Introductory phone call (10/31/16 1300)   Abnormal Finding Date: 08/31/16 (10/31/16 1300) Confirmed Diagnosis Date: 09/13/16 (10/31/16 1300) Surgery Date: 10/10/16 (10/31/16 1300)           Treatment Initiated Date: 10/10/16 (10/31/16 1300) Patient Visit Type: MedOnc;Initial (10/31/16 1300)   Barriers/Navigation Needs: No barriers at this time (10/31/16 1300)                Acuity: Level 1 (10/31/16 1300) Acuity Level 1: Initial guidance, education and coordination as needed;Minimal follow up required (10/31/16 1300)       Time Spent with Patient: 15 (10/31/16 1300)

## 2016-11-01 ENCOUNTER — Ambulatory Visit: Payer: No Typology Code available for payment source | Admitting: Hematology

## 2016-11-01 ENCOUNTER — Other Ambulatory Visit: Payer: No Typology Code available for payment source

## 2016-11-03 ENCOUNTER — Ambulatory Visit
Admission: RE | Admit: 2016-11-03 | Discharge: 2016-11-03 | Disposition: A | Discharge status: Home / Self Care | Payer: Self-pay | Source: Ambulatory Visit | Attending: Radiation Oncology | Admitting: Radiation Oncology

## 2016-11-03 DIAGNOSIS — C50412 Malignant neoplasm of upper-outer quadrant of left female breast: Secondary | ICD-10-CM

## 2016-11-03 DIAGNOSIS — Z171 Estrogen receptor negative status [ER-]: Principal | ICD-10-CM

## 2016-11-05 NOTE — Progress Notes (Signed)
  Radiation Oncology         [phone] ________________________________  Name: Alyssa Riley MRN: [id number]  Date: 11/03/2016  DOB: [birth year]  DIAGNOSIS:     ICD-9-CM ICD-10-CM   1. DCIS left breast; ER negative 174.4 C50.412    V86.1 Z17.1      SIMULATION AND TREATMENT PLANNING NOTE  The patient presented for simulation prior to beginning her course of radiation treatment for her diagnosis of left-sided breast cancer. The patient was placed in a supine position on a breast board. A customized vac-lock bag was constructed and this complex treatment device will be used on a daily basis during her treatment. In this fashion, a CT scan was obtained through the chest area and an isocenter was placed near the chest wall within the breast.  The patient will be planned to receive a course of radiation initially to a dose of 50 Gy. This will consist of a whole breast radiotherapy technique. To accomplish this, 2 customized blocks have been designed which will correspond to medial and lateral whole breast tangent fields. This treatment will be accomplished at 2 Gy per fraction. A forward planning technique will also be evaluated to determine if this approach improves the plan. It is anticipated that the patient will then receive a 14.4 Gy boost to the seroma cavity which has been contoured. This will be accomplished at 1.8 Gy per fraction.   This initial treatment will consist of a 3-D conformal technique. The seroma has been contoured as the primary target structure. Additionally, dose volume histograms of both this target as well as the lungs and heart will also be evaluated. Such an approach is necessary to ensure that the target area is adequately covered while the nearby critical  normal structures are adequately spared.  Plan:  The final anticipated total dose therefore will correspond to 64.4 Gy.  Special treatment procedure was performed today due to the extra time and effort required by  myself to plan and prepare this patient for deep inspiration breath hold technique.  I have determined cardiac sparing to be of benefit to this patient to prevent long term cardiac damage due to radiation of the heart.  Bellows were placed on the patient's abdomen. To facilitate cardiac sparing, the patient was coached by the radiation therapists on breath hold techniques and breathing practice was performed. Practice waveforms were obtained. The patient was then scanned while maintaining breath hold in the treatment position.  This image was then transferred over to the imaging specialist. The imaging specialist then created a fusion of the free breathing and breath hold scans using the chest wall as the stable structure. I personally reviewed the fusion in axial, coronal and sagittal image planes.  Excellent cardiac sparing was obtained.  I felt the patient is an appropriate candidate for breath hold and the patient will be treated as such.  The image fusion was then reviewed with the patient to reinforce the necessity of reproducible breath hold.      _______________________________   Jodelle Gross, MD, PhD

## 2016-11-05 NOTE — Progress Notes (Signed)
  Radiation Oncology         [phone] ________________________________  Name: Alyssa Riley MRN: [id number]  Date: 11/03/2016  DOB: Jan 17, 1961  Optical Surface Tracking Plan:  Since intensity modulated radiotherapy (IMRT) and 3D conformal radiation treatment methods are predicated on accurate and precise positioning for treatment, intrafraction motion monitoring is medically necessary to ensure accurate and safe treatment delivery.  The ability to quantify intrafraction motion without excessive ionizing radiation dose can only be performed with optical surface tracking. Accordingly, surface imaging offers the opportunity to obtain 3D measurements of patient position throughout IMRT and 3D treatments without excessive radiation exposure.  I am ordering optical surface tracking for this patient's upcoming course of radiotherapy. ________________________________  Kyung Rudd, MD 11/05/2016 3:23 PM    Reference:   Particia Jasper, et al. Surface imaging-based analysis of intrafraction motion for breast radiotherapy patients.Journal of [place], n. 6, nov. 2014. ISSN [id number].   Available at: <http://www.jacmp.org/index.php/jacmp/article/view/4957>.

## 2016-11-06 ENCOUNTER — Other Ambulatory Visit: Payer: Self-pay | Admitting: *Deleted

## 2016-11-10 ENCOUNTER — Ambulatory Visit
Admission: RE | Admit: 2016-11-10 | Discharge: 2016-11-10 | Disposition: A | Discharge status: Home / Self Care | Payer: Self-pay | Source: Ambulatory Visit | Attending: Radiation Oncology | Admitting: Radiation Oncology

## 2016-11-13 ENCOUNTER — Ambulatory Visit
Admission: RE | Admit: 2016-11-13 | Discharge: 2016-11-13 | Disposition: A | Discharge status: Home / Self Care | Payer: Self-pay | Source: Ambulatory Visit | Attending: Radiation Oncology | Admitting: Radiation Oncology

## 2016-11-13 ENCOUNTER — Inpatient Hospital Stay
Admission: RE | Admit: 2016-11-13 | Discharge: 2016-11-13 | Disposition: A | Discharge status: Home / Self Care | Payer: Self-pay | Source: Ambulatory Visit | Attending: Radiation Oncology | Admitting: Radiation Oncology

## 2016-11-13 DIAGNOSIS — Z171 Estrogen receptor negative status [ER-]: Principal | ICD-10-CM

## 2016-11-13 DIAGNOSIS — C50412 Malignant neoplasm of upper-outer quadrant of left female breast: Secondary | ICD-10-CM

## 2016-11-13 MED ORDER — ALRA NON-METALLIC DEODORANT (RAD-ONC)
1.0000 "application " | Freq: Once | TOPICAL | Status: AC
  Administered 2016-11-13: 1 via TOPICAL

## 2016-11-13 MED ORDER — RADIAPLEXRX EX GEL
Freq: Once | CUTANEOUS | Status: AC
  Administered 2016-11-13: 15:00:00 via TOPICAL

## 2016-11-13 NOTE — Progress Notes (Signed)
Radiation therapy and you book, my business card, alra, radiaplex cream given, discussed ways to manage fatigue,skin irritation, exercise, get plenty sleep, recommended dove unscented soap[, luke warm bath/showers, , pat dry, no rubbinf, scrubbing or scratching skin area treated, electric razor when shaving, increase protein in diet, stay hydrated,drink plenty water,pain, may get sharp pains or twings in breast ,normal, teach back given 2:55 PM

## 2016-11-14 ENCOUNTER — Ambulatory Visit
Admission: RE | Admit: 2016-11-14 | Discharge: 2016-11-14 | Disposition: A | Discharge status: Home / Self Care | Payer: Self-pay | Source: Ambulatory Visit | Attending: Radiation Oncology | Admitting: Radiation Oncology

## 2016-11-15 ENCOUNTER — Ambulatory Visit
Admission: RE | Admit: 2016-11-15 | Discharge: 2016-11-15 | Disposition: A | Discharge status: Home / Self Care | Payer: Self-pay | Source: Ambulatory Visit | Attending: Radiation Oncology | Admitting: Radiation Oncology

## 2016-11-16 ENCOUNTER — Ambulatory Visit
Admission: RE | Admit: 2016-11-16 | Discharge: 2016-11-16 | Disposition: A | Discharge status: Home / Self Care | Payer: Self-pay | Source: Ambulatory Visit | Attending: Radiation Oncology | Admitting: Radiation Oncology

## 2016-11-17 ENCOUNTER — Ambulatory Visit
Admission: RE | Admit: 2016-11-17 | Discharge: 2016-11-17 | Disposition: A | Discharge status: Home / Self Care | Payer: Self-pay | Source: Ambulatory Visit | Attending: Radiation Oncology | Admitting: Radiation Oncology

## 2016-11-20 ENCOUNTER — Ambulatory Visit
Admission: RE | Admit: 2016-11-20 | Discharge: 2016-11-20 | Disposition: A | Discharge status: Home / Self Care | Payer: Self-pay | Source: Ambulatory Visit | Attending: Radiation Oncology | Admitting: Radiation Oncology

## 2016-11-21 ENCOUNTER — Ambulatory Visit
Admission: RE | Admit: 2016-11-21 | Discharge: 2016-11-21 | Disposition: A | Discharge status: Home / Self Care | Payer: Self-pay | Source: Ambulatory Visit | Attending: Radiation Oncology | Admitting: Radiation Oncology

## 2016-11-22 ENCOUNTER — Ambulatory Visit
Admission: RE | Admit: 2016-11-22 | Discharge: 2016-11-22 | Disposition: A | Discharge status: Home / Self Care | Payer: Self-pay | Source: Ambulatory Visit | Attending: Radiation Oncology | Admitting: Radiation Oncology

## 2016-11-23 ENCOUNTER — Ambulatory Visit
Admission: RE | Admit: 2016-11-23 | Discharge: 2016-11-23 | Disposition: A | Discharge status: Home / Self Care | Payer: Self-pay | Source: Ambulatory Visit | Attending: Radiation Oncology | Admitting: Radiation Oncology

## 2016-11-24 ENCOUNTER — Ambulatory Visit
Admission: RE | Admit: 2016-11-24 | Discharge: 2016-11-24 | Disposition: A | Discharge status: Home / Self Care | Payer: Self-pay | Source: Ambulatory Visit | Attending: Radiation Oncology | Admitting: Radiation Oncology

## 2016-11-27 ENCOUNTER — Ambulatory Visit
Admission: RE | Admit: 2016-11-27 | Discharge: 2016-11-27 | Disposition: A | Discharge status: Home / Self Care | Payer: Self-pay | Source: Ambulatory Visit | Attending: Radiation Oncology | Admitting: Radiation Oncology

## 2016-11-28 ENCOUNTER — Ambulatory Visit
Admission: RE | Admit: 2016-11-28 | Discharge: 2016-11-28 | Disposition: A | Discharge status: Home / Self Care | Payer: Self-pay | Source: Ambulatory Visit | Attending: Radiation Oncology | Admitting: Radiation Oncology

## 2016-11-29 ENCOUNTER — Ambulatory Visit
Admission: RE | Admit: 2016-11-29 | Discharge: 2016-11-29 | Disposition: A | Discharge status: Home / Self Care | Payer: Self-pay | Source: Ambulatory Visit | Attending: Radiation Oncology | Admitting: Radiation Oncology

## 2016-11-30 ENCOUNTER — Ambulatory Visit
Admission: RE | Admit: 2016-11-30 | Discharge: 2016-11-30 | Disposition: A | Discharge status: Home / Self Care | Payer: Self-pay | Source: Ambulatory Visit | Attending: Radiation Oncology | Admitting: Radiation Oncology

## 2016-12-01 ENCOUNTER — Ambulatory Visit
Admission: RE | Admit: 2016-12-01 | Discharge: 2016-12-01 | Disposition: A | Discharge status: Home / Self Care | Payer: Self-pay | Source: Ambulatory Visit | Attending: Radiation Oncology | Admitting: Radiation Oncology

## 2016-12-01 DIAGNOSIS — C50412 Malignant neoplasm of upper-outer quadrant of left female breast: Secondary | ICD-10-CM

## 2016-12-01 DIAGNOSIS — Z171 Estrogen receptor negative status [ER-]: Principal | ICD-10-CM

## 2016-12-01 MED ORDER — RADIAPLEXRX EX GEL
Freq: Once | CUTANEOUS | Status: AC
  Administered 2016-12-01: 16:00:00 via TOPICAL

## 2016-12-04 ENCOUNTER — Ambulatory Visit
Admission: RE | Admit: 2016-12-04 | Discharge: 2016-12-04 | Disposition: A | Discharge status: Home / Self Care | Payer: Self-pay | Source: Ambulatory Visit | Attending: Radiation Oncology | Admitting: Radiation Oncology

## 2016-12-05 ENCOUNTER — Ambulatory Visit
Admission: RE | Admit: 2016-12-05 | Discharge: 2016-12-05 | Disposition: A | Discharge status: Home / Self Care | Payer: Self-pay | Source: Ambulatory Visit | Attending: Radiation Oncology | Admitting: Radiation Oncology

## 2016-12-06 ENCOUNTER — Ambulatory Visit
Admission: RE | Admit: 2016-12-06 | Discharge: 2016-12-06 | Disposition: A | Discharge status: Home / Self Care | Payer: Self-pay | Source: Ambulatory Visit | Attending: Radiation Oncology | Admitting: Radiation Oncology

## 2016-12-07 ENCOUNTER — Ambulatory Visit
Admission: RE | Admit: 2016-12-07 | Discharge: 2016-12-07 | Disposition: A | Discharge status: Home / Self Care | Payer: Self-pay | Source: Ambulatory Visit | Attending: Radiation Oncology | Admitting: Radiation Oncology

## 2016-12-07 ENCOUNTER — Encounter: Payer: Self-pay | Admitting: Radiation Oncology

## 2016-12-07 VITALS — BP 125/67 | HR 79 | Temp 97.8°F | Resp 18 | Wt 145.6 lb

## 2016-12-07 DIAGNOSIS — C50412 Malignant neoplasm of upper-outer quadrant of left female breast: Secondary | ICD-10-CM

## 2016-12-07 DIAGNOSIS — Z171 Estrogen receptor negative status [ER-]: Principal | ICD-10-CM

## 2016-12-07 NOTE — Progress Notes (Signed)
  Radiation Oncology         [phone] ________________________________  Name: Alyssa Riley MRN: [id number]  Date: 12/07/2016  DOB: 1961-02-20  Weekly Radiation Therapy Management    ICD-9-CM ICD-10-CM   1. DCIS left breast; ER negative 174.4 C50.412    V86.1 Z17.1      Current Dose: 38 Gy     Planned Dose:  64.4 Gy  Narrative . . . . . . . . The patient presents for routine under treatment assessment. Pt endorses radiaplex BID. She denies pain or appetite changes. She reports that she is still able to play tennis in the mornings. Pt states she has "just a little itching."                                  Set-up films were reviewed.                                 The chart was checked. Physical Findings. . .  weight is 145 lb 9.6 oz (66 kg). Her oral temperature is 97.8 F (36.6 C). Her blood pressure is 125/67 and her pulse is 79. Her respiration is 18. . Weight essentially stable.  No significant changes. Lungs are clear to auscultation bilaterally. Heart has regular rate and rhythm.  Mild to moderate erythema noted on the left breast.  Impression . . . . . . . The patient is tolerating radiation. Plan . . . . . . . . . . . . Continue treatment as planned.  ________________________________   Blair Promise, PhD, MD   This document serves as a record of services personally performed by Gery Pray, MD. It was created on his behalf by Linward Natal, a trained medical scribe. The creation of this record is based on the scribe's personal observations and the provider's statements to them. This document has been checked and approved by the attending provider.

## 2016-12-07 NOTE — Progress Notes (Signed)
Weekly rad  tx  Left breast 19/33 completed, mild rash on left breast  Itchy mild, using radiaplex bid, no pain, appetite good, plays tennis in mornings 2:53 PM BP 125/67 (BP Location: Right Arm, Patient Position: Sitting, Cuff Size: Normal)   Pulse 79   Temp 97.8 F (36.6 C) (Oral)   Resp 18   Wt 145 lb 9.6 oz (66 kg)   BMI 22.80 kg/m   Wt Readings from Last 3 Encounters:  12/07/16 145 lb 9.6 oz (66 kg)  10/30/16 144 lb 6.4 oz (65.5 kg)  10/23/16 145 lb 6.4 oz (66 kg)

## 2016-12-08 ENCOUNTER — Ambulatory Visit
Admission: RE | Admit: 2016-12-08 | Discharge: 2016-12-08 | Disposition: A | Discharge status: Home / Self Care | Payer: Self-pay | Source: Ambulatory Visit | Attending: Radiation Oncology | Admitting: Radiation Oncology

## 2016-12-11 ENCOUNTER — Ambulatory Visit
Admission: RE | Admit: 2016-12-11 | Discharge: 2016-12-11 | Disposition: A | Discharge status: Home / Self Care | Payer: Self-pay | Source: Ambulatory Visit | Attending: Radiation Oncology | Admitting: Radiation Oncology

## 2016-12-11 ENCOUNTER — Ambulatory Visit: Payer: Self-pay | Admitting: Radiation Oncology

## 2016-12-12 ENCOUNTER — Ambulatory Visit
Admission: RE | Admit: 2016-12-12 | Discharge: 2016-12-12 | Disposition: A | Discharge status: Home / Self Care | Payer: Self-pay | Source: Ambulatory Visit | Attending: Radiation Oncology | Admitting: Radiation Oncology

## 2016-12-13 ENCOUNTER — Ambulatory Visit
Admission: RE | Admit: 2016-12-13 | Discharge: 2016-12-13 | Disposition: A | Discharge status: Home / Self Care | Payer: Self-pay | Source: Ambulatory Visit | Attending: Radiation Oncology | Admitting: Radiation Oncology

## 2016-12-13 ENCOUNTER — Encounter: Payer: Self-pay | Admitting: Radiation Oncology

## 2016-12-14 ENCOUNTER — Ambulatory Visit
Admission: RE | Admit: 2016-12-14 | Discharge: 2016-12-14 | Disposition: A | Discharge status: Home / Self Care | Payer: Self-pay | Source: Ambulatory Visit | Attending: Radiation Oncology | Admitting: Radiation Oncology

## 2016-12-15 ENCOUNTER — Ambulatory Visit
Admission: RE | Admit: 2016-12-15 | Discharge: 2016-12-15 | Disposition: A | Discharge status: Home / Self Care | Payer: Self-pay | Source: Ambulatory Visit | Attending: Radiation Oncology | Admitting: Radiation Oncology

## 2016-12-18 ENCOUNTER — Ambulatory Visit
Admission: RE | Admit: 2016-12-18 | Discharge: 2016-12-18 | Disposition: A | Discharge status: Home / Self Care | Payer: Self-pay | Source: Ambulatory Visit | Attending: Radiation Oncology | Admitting: Radiation Oncology

## 2016-12-19 ENCOUNTER — Ambulatory Visit
Admission: RE | Admit: 2016-12-19 | Discharge: 2016-12-19 | Disposition: A | Discharge status: Home / Self Care | Payer: Self-pay | Source: Ambulatory Visit | Attending: Radiation Oncology | Admitting: Radiation Oncology

## 2016-12-20 ENCOUNTER — Ambulatory Visit
Admission: RE | Admit: 2016-12-20 | Discharge: 2016-12-20 | Disposition: A | Discharge status: Home / Self Care | Payer: Self-pay | Source: Ambulatory Visit | Attending: Radiation Oncology | Admitting: Radiation Oncology

## 2016-12-21 ENCOUNTER — Ambulatory Visit
Admission: RE | Admit: 2016-12-21 | Discharge: 2016-12-21 | Disposition: A | Discharge status: Home / Self Care | Payer: Self-pay | Source: Ambulatory Visit | Attending: Radiation Oncology | Admitting: Radiation Oncology

## 2016-12-22 ENCOUNTER — Ambulatory Visit
Admission: RE | Admit: 2016-12-22 | Discharge: 2016-12-22 | Disposition: A | Discharge status: Home / Self Care | Payer: Self-pay | Source: Ambulatory Visit | Attending: Radiation Oncology | Admitting: Radiation Oncology

## 2016-12-22 NOTE — Progress Notes (Signed)
Complex simulation note  Diagnosis: left-sided breast cancer  Narrative The patient has initially been planned to receive a course of whole breast radiation to a dose of 50.4 Gy in 28 fractions. The patient will now receive an additional boost to the seroma cavity which has been contoured. This will correspond to a boost of 14 Gy at 2 Gy per fraction. To accomplish this, an additional 3 customized blocks have been designed for this purpose. A complex isodose plan is requested to ensure that the target area is adequately covered with radiation dose and that the nearby normal structures such as the lung are adequately spared. The patient's final total dose will be 64.4 Gy.  ------------------------------------------------  Jodelle Gross, MD, PhD

## 2016-12-25 ENCOUNTER — Ambulatory Visit
Admission: RE | Admit: 2016-12-25 | Discharge: 2016-12-25 | Disposition: A | Discharge status: Home / Self Care | Payer: Self-pay | Source: Ambulatory Visit | Attending: Radiation Oncology | Admitting: Radiation Oncology

## 2016-12-26 ENCOUNTER — Ambulatory Visit
Admission: RE | Admit: 2016-12-26 | Discharge: 2016-12-26 | Disposition: A | Discharge status: Home / Self Care | Payer: Self-pay | Source: Ambulatory Visit | Attending: Radiation Oncology | Admitting: Radiation Oncology

## 2016-12-27 ENCOUNTER — Ambulatory Visit
Admission: RE | Admit: 2016-12-27 | Discharge: 2016-12-27 | Disposition: A | Discharge status: Home / Self Care | Payer: Self-pay | Source: Ambulatory Visit | Attending: Radiation Oncology | Admitting: Radiation Oncology

## 2016-12-27 ENCOUNTER — Telehealth: Payer: Self-pay | Admitting: *Deleted

## 2016-12-27 NOTE — Telephone Encounter (Signed)
  Oncology Nurse Navigator Documentation  Navigator Location: [facility] (12/27/16 1600)   )Navigator Encounter Type: Telephone (12/27/16 1600) Telephone: L[facility] Crocker Call (12/27/16 1600)                       Barriers/Navigation Needs: No barriers at this time;No Questions;No Needs (12/27/16 1600)   Interventions: Referrals (12/27/16 1600) Referrals: Survivorship (12/27/16 1600)          Acuity: Level 1 (12/27/16 1600)         Time Spent with Patient: 15 (12/27/16 1600)

## 2017-01-04 ENCOUNTER — Ambulatory Visit (INDEPENDENT_AMBULATORY_CARE_PROVIDER_SITE_OTHER): Payer: Self-pay | Admitting: Physician Assistant

## 2017-01-04 ENCOUNTER — Encounter: Payer: Self-pay | Admitting: Physician Assistant

## 2017-01-04 ENCOUNTER — Ambulatory Visit (INDEPENDENT_AMBULATORY_CARE_PROVIDER_SITE_OTHER): Payer: Self-pay

## 2017-01-04 VITALS — BP 120/80 | HR 63 | Temp 97.8°F | Ht 67.0 in | Wt 143.5 lb

## 2017-01-04 DIAGNOSIS — M545 Low back pain: Secondary | ICD-10-CM

## 2017-01-04 MED ORDER — CYCLOBENZAPRINE HCL 5 MG PO TABS: 5.0000 mg | ORAL_TABLET | Freq: Every day | ORAL | 0 refills | Status: DC

## 2017-01-04 MED ORDER — METHYLPREDNISOLONE 4 MG PO TBPK: ORAL_TABLET | ORAL | 0 refills | Status: DC

## 2017-01-04 NOTE — Progress Notes (Signed)
Alyssa Riley is a 56 y.o. female here to Establish care and complaint today is lower back pain.  I acted as a Education administrator for [employer], PA-C Anselmo Pickler, LPN  History of Present Illness:   Chief Complaint  Patient presents with  . Establish Care  . Back Pain    x 6 days and today started radiating down front of right leg   Acute Concerns: Back pain -- Patient reports of lower back pain 6 days. She was playing tennis last week and complained 3 games last week which she usually does however she notes that 2 of them were much longer than usual, and she was playing on a hard court. She does not remember a specific instance where she twisted or pulled her back, however she has noticed that her back pain since playing tennis has worsened. She has been taking 400 mg ibuprofen around the clock most recently took this morning around 8 AM. She has no prior history of injuring her back. The pain has now began to radiate down the front of her right leg to her mid thigh. She denies saddle anesthesia, issues with bowel or bladder incontinence, numbness, tingling, fevers. She does have a recent history of breast cancer, recently had a final radiation treatment at the end of April. She is interested in osteopathic manipulation and has an appointment with Dr. Teresa Coombs tomorrow, however her pain was so severe that she wanted to be seen today she is establishing with [place].  She reports that she has been seen by [facility] for several years, she is planning to transfer care here, she last had a physical last month, and we are requesting those records today.  Weight -- Weight: 143 lb 8 oz (65.1 kg)   Depression screen PHQ 2/9 01/04/2017  Decreased Interest 0  Down, Depressed, Hopeless 0  PHQ - 2 Score 0   Other providers/specialists: Dr. Kyung Rudd -- radiation oncology  PMHx, SurgHx, SocialHx, Medications, and Allergies were reviewed in the Visit Navigator and updated as appropriate.  Current  Medications:   Current Outpatient Prescriptions:  .  b complex vitamins tablet, Take 1 tablet by mouth daily., Disp: , Rfl:  .  Calcium Carbonate-Vit D-Min (CALCIUM 1200 PO), Take by mouth., Disp: , Rfl:  .  ibuprofen (ADVIL,MOTRIN) 200 MG tablet, Take 400 mg by mouth every 4 (four) hours as needed., Disp: , Rfl:  .  Multiple Vitamin (MULTIVITAMIN) capsule, Take 1 capsule by mouth daily., Disp: , Rfl:  .  vitamin C (ASCORBIC ACID) 500 MG tablet, Take 500 mg by mouth daily., Disp: , Rfl:  .  cyclobenzaprine (FLEXERIL) 5 MG tablet, Take 1 tablet (5 mg total) by mouth at bedtime., Disp: 20 tablet, Rfl: 0 .  methylPREDNISolone (MEDROL DOSEPAK) 4 MG TBPK tablet, 6-5-4-3-2-1-off, Disp: 21 tablet, Rfl: 0   Review of Systems:   Review of Systems  Constitutional: Negative.   HENT: Negative.   Eyes: Negative.   Respiratory: Negative.   Cardiovascular: Negative.   Gastrointestinal: Negative.   Genitourinary: Negative.   Musculoskeletal: Positive for back pain.       Low back pain  Skin: Positive for itching.       Left breast area from radiation treatrments  Neurological: Negative.   Endo/Heme/Allergies: Negative.   Psychiatric/Behavioral: Negative.     Vitals:   Vitals:   01/04/17 1121  BP: 120/80  Pulse: 63  Temp: 97.8 F (36.6 C)  TempSrc: Oral  SpO2: 99%  Weight: 143 lb  8 oz (65.1 kg)  Height: 5\' 7"  (1.702 m)     Body mass index is 22.48 kg/m.  Physical Exam:   Physical Exam  Constitutional: She appears well-developed. She is cooperative.  Non-toxic appearance. She does not have a sickly appearance. She does not appear ill. No distress.  Cardiovascular: Normal rate, regular rhythm, S1 normal, S2 normal, normal heart sounds and normal pulses.   No LE edema  Pulmonary/Chest: Effort normal and breath sounds normal.  Musculoskeletal:       Lumbar back: She exhibits spasm (bilateral lumbar paraspinal muscles). She exhibits no bony tenderness.  No decreased range of motion  with lateral bending, forward flexion/extension, and rotation. Negative straight leg raise bilaterally.  Neurological: She is alert. No cranial nerve deficit or sensory deficit. GCS eye subscore is 4. GCS verbal subscore is 5. GCS motor subscore is 6.  Nursing note and vitals reviewed.  LUMBAR SPINE - 2-3 VIEW IMPRESSION: 1.  No evidence for acute osseous abnormality. 2. Cholelithiasis.  Assessment and Plan:    Jamison was seen today for establish care and back pain.  Diagnoses and all orders for this visit:  Acute bilateral low back pain, with sciatica presence unspecified Lumbar spine x-ray shows no evidence for acute osseous abnormality. Reviewed case briefly with Dr. Teresa Coombs, appreciate coordination of care. Patient would benefit from Medrol dosepak to help with inflammation. I have also given her a low dose Flexeril to help with muscle spasms, I advised her to take half a tablet to start with to make sure it isn't causing too much drowsiness. She is still going to see Dr. Paulla Fore tomorrow for further evaluation and treatment.  -     DG Lumbar Spine 2-3 Views; Future -     methylPREDNISolone (MEDROL DOSEPAK) 4 MG TBPK tablet; 6-5-4-3-2-1-off -     cyclobenzaprine (FLEXERIL) 5 MG tablet; Take 1 tablet (5 mg total) by mouth at bedtime.   . Reviewed expectations re: course of current medical issues. . Discussed self-management of symptoms. . Outlined signs and symptoms indicating need for more acute intervention. . Patient verbalized understanding and all questions were answered. . See orders for this visit as documented in the electronic medical record. . Patient received an After-Visit Summary.  CMA or LPN served as scribe during this visit. History, Physical, and Plan performed by medical provider. Documentation and orders reviewed and attested to.  Inda Coke, PA-C

## 2017-01-04 NOTE — Patient Instructions (Addendum)
It was great meeting you today!  Start the medrol dose pack: Usual Directions for Use Directions for Medrol Dosepak (4 mg tablets): Day 1: Two tablets before breakfast, one after lunch, one after dinner, and two at bedtime. If started late in the day, take all six tablets at once or divide into two or three doses, unless otherwise directed by prescriber. Day 2: One tablet before breakfast, one after lunch, one after dinner, and two at bedtime Day 3: One tablet before breakfast, one after lunch, one after dinner, and one at bedtime Day 4: One tablet before breakfast, one after lunch, and one at bedtime Day 5: One tablet before breakfast and one at bedtime Day 6: One tablet before breakfast   May take flexeril to help with muscle spasm. Start with half a tablet to make sure it doesn't make you too drowsy!   Back Pain, Adult Back pain is very common in adults.The cause of back pain is rarely dangerous and the pain often gets better over time.The cause of your back pain may not be known. Some common causes of back pain include:  Strain of the muscles or ligaments supporting the spine.  Wear and tear (degeneration) of the spinal disks.  Arthritis.  Direct injury to the back. For many people, back pain may return. Since back pain is rarely dangerous, most people can learn to manage this condition on their own. Follow these instructions at home: Watch your back pain for any changes. The following actions may help to lessen any discomfort you are feeling:  Remain active. It is stressful on your back to sit or stand in one place for long periods of time. Do not sit, drive, or stand in one place for more than 30 minutes at a time. Take short walks on even surfaces as soon as you are able.Try to increase the length of time you walk each day.  Exercise regularly as directed by your health care provider. Exercise helps your back heal faster. It also helps avoid future injury by keeping your  muscles strong and flexible.  Do not stay in bed.Resting more than 1-2 days can delay your recovery.  Pay attention to your body when you bend and lift. The most comfortable positions are those that put less stress on your recovering back. Always use proper lifting techniques, including:  Bending your knees.  Keeping the load close to your body.  Avoiding twisting.  Find a comfortable position to sleep. Use a firm mattress and lie on your side with your knees slightly bent. If you lie on your back, put a pillow under your knees.  Avoid feeling anxious or stressed.Stress increases muscle tension and can worsen back pain.It is important to recognize when you are anxious or stressed and learn ways to manage it, such as with exercise.  Take medicines only as directed by your health care provider. Over-the-counter medicines to reduce pain and inflammation are often the most helpful.Your health care provider may prescribe muscle relaxant drugs.These medicines help dull your pain so you can more quickly return to your normal activities and healthy exercise.  Apply ice to the injured area:  Put ice in a plastic bag.  Place a towel between your skin and the bag.  Leave the ice on for 20 minutes, 2-3 times a day for the first 2-3 days. After that, ice and heat may be alternated to reduce pain and spasms.  Maintain a healthy weight. Excess weight puts extra stress on your back and  makes it difficult to maintain good posture. Contact a health care provider if:  You have pain that is not relieved with rest or medicine.  You have increasing pain going down into the legs or buttocks.  You have pain that does not improve in one week.  You have night pain.  You lose weight.  You have a fever or chills. Get help right away if:  You develop new bowel or bladder control problems.  You have unusual weakness or numbness in your arms or legs.  You develop nausea or vomiting.  You  develop abdominal pain.  You feel faint. This information is not intended to replace advice given to you by your health care provider. Make sure you discuss any questions you have with your health care provider. Document Released: 08/21/2005 Document Revised: 12/30/2015 Document Reviewed: 12/23/2013 Elsevier Interactive Patient Education  2017 [employer].

## 2017-01-05 ENCOUNTER — Ambulatory Visit (INDEPENDENT_AMBULATORY_CARE_PROVIDER_SITE_OTHER): Payer: Self-pay | Admitting: Sports Medicine

## 2017-01-05 ENCOUNTER — Telehealth: Payer: Self-pay | Admitting: *Deleted

## 2017-01-05 DIAGNOSIS — M9902 Segmental and somatic dysfunction of thoracic region: Secondary | ICD-10-CM

## 2017-01-05 DIAGNOSIS — M9905 Segmental and somatic dysfunction of pelvic region: Secondary | ICD-10-CM

## 2017-01-05 DIAGNOSIS — M546 Pain in thoracic spine: Secondary | ICD-10-CM

## 2017-01-05 DIAGNOSIS — M9903 Segmental and somatic dysfunction of lumbar region: Secondary | ICD-10-CM

## 2017-01-05 NOTE — Patient Instructions (Signed)
Please perform the exercise program that Alyssa Riley has prepared for you and gone over in detail on a daily basis.  In addition to the handout you were provided you can access your program through: [email or web address]   Your unique program code is: [email or web address]

## 2017-01-05 NOTE — Telephone Encounter (Signed)
CALLED PATIENT TO ALTER FU ON 02-13-17 TO 03-21-17 @ 1:30 PM, PT. AGREED TO NEW TIME AND DATE

## 2017-01-05 NOTE — Progress Notes (Signed)
OFFICE VISIT NOTE Alyssa Riley. [name], [facility] [place] - 56 y.o. female MRN [id number]  Date of birth: 1961/01/05  Visit Date: 01/05/2017  PCP: Inda Coke, PA   Referred by: Jonathon Jordan, MD  [address], cma acting as scribe for Dr. Paulla Fore.  SUBJECTIVE:   Chief Complaint  Patient presents with  . NP: Back Pain   HPI: As below and per problem based documentation when appropriate.  Back pain:  Patient reports of lower back pain 6 days.She does not remember a specific instance where she twisted or pulled her back, however she has noticed that her back pain since playing tennis has worsened. She has been taking 400 mg ibuprofen but stopped yesterday.  She has no prior history of injuring her back. The pain has now began to radiate down the front of her right leg to her mid thigh.  She is interested in osteopathic manipulation today. She is on day 2 of steroid dose pack and took one dose of steriod dose pack with much relief. Pain is not constant like yesterday.     ROS  Otherwise per HPI.  HISTORY & PERTINENT PRIOR DATA:  No specialty comments available. She reports that she has never smoked. She has never used smokeless tobacco. No results for input(s): HGBA1C, LABURIC in the last 8760 hours. Medications & Allergies reviewed per EMR Patient Active Problem List   Diagnosis Date Noted  . Thoracic back pain 01/25/2017  . DCIS left breast; ER negative 10/24/2016   Past Medical History:  Diagnosis Date  . Breast cancer ([place]) 09/13/2016   left breast   . DVT (deep venous thrombosis) ([place]) 06/2016   Left calf  . Factor 5 Leiden mutation, heterozygous ([place])    No family history on file. Past Surgical History:  Procedure Laterality Date  . BREAST LUMPECTOMY WITH RADIOACTIVE SEED LOCALIZATION Left 10/10/2016   Procedure: LEFT BREAST LUMPECTOMY WITH RADIOACTIVE SEED LOCALIZATION;  Surgeon: Donnie Mesa, MD;  Location: [facility];  Service: General;  Laterality: Left;  . DILATION AND CURETTAGE OF UTERUS  1998  . FRACTURE SURGERY Left    foot at age 86   Social History   Occupational History  . Not on file.   Social History Main Topics  . Smoking status: Never Smoker  . Smokeless tobacco: Never Used  . Alcohol use Yes     Comment: social   . Drug use: No  . Sexual activity: Yes     Comment: vasectomy    OBJECTIVE:  VS:  HT:5\' 7"  (170.2 cm)   WT:143 lb 12.8 oz (65.2 kg)  BMI:22.6    BP:110/70  HR:81bpm  TEMP: ( )  RESP:98 % EXAM: Findings:   WDWN, NAD, Non-toxic appearing Alert & appropriately interactive Not depressed or anxious appearing No increased work of breathing. Pupils are equal. EOM intact without nystagmus No clubbing or cyanosis of the extremities appreciated No significant rashes/lesions/ulcerations overlying the examined area. DP & PT pulses 2+/4.  No significant pretibial edema.  No clubbing or cyanosis Sensation intact to light touch in lower extremities.  Back: Bilateral straight leg raises are tight but nonpainful.  Right is tighter than left.  She has good internal/external rotation of bilateral hips.  She has poor thoracic mobility.  Strength is intact in bilateral lower extremities to manual muscle testing.  OSTEOPATHIC/STRUCTURAL EXAM:   T6 through T10 neutral flexed right L3 FRS left  L5 FRS right Right anterior innominate       Dg Lumbar Spine 2-3 Views  Result Date: 01/04/2017 CLINICAL DATA:  Back pain for six days. Unsure of an exact injury; possible overuse during tennis. Pt just finished radiation treatments for breast cancer. EXAM: LUMBAR SPINE - 2-3 VIEW COMPARISON:  None. FINDINGS: There is no acute fracture or traumatic subluxation. No suspicious lytic or blastic lesions are identified. Note is made of numerous calcified gallstones. IMPRESSION: 1.  No evidence for acute osseous abnormality. 2. Cholelithiasis.  Electronically Signed   By: Nolon Nations M.D.   On: 01/04/2017 12:32   ASSESSMENT & PLAN:   Problem List Items Addressed This Visit    Thoracic back pain    Pain does seem to be related to musculoskeletal causes and is responding well to a steroid Dosepak. Osteopathic medication performed today with good improvement in her symptoms.  Home exercises reviewed working on core conditioning including thoracic mobility.  PROCEDURE NOTE : OSTEOPATHIC MANIPULATION The decision today to treat with Osteopathic Manipulative Therapy (OMT) was based on physical exam findings. Verbal consent was obtained after after explanation of risks, benefits and potential side effects, including acute pain flare, post manipulation soreness and need for repeat treatments.  If Cervical manipulation was performed additional time was spent discussing the associated minimal risk of  injury to neurovascular structures.  After consent was obtained manipulation was performed as below:            Regions treated:  Per billing codes          Techniques used:  Direct, Muscle Energy, MFR and HVLA The patient tolerated the treatment well and reported Improved symptoms following treatment today. Patient was given medications, exercises, stretches and lifestyle modifications per AVS and verbally.     +++++++++++++++++++++++++++++++++++++++++++++++++++++++++++++++ PROCEDURE NOTE: THERAPEUTIC EXERCISES (97110) 15 minutes spent for Therapeutic exercises as stated in above notes.  This included exercises focusing on stretching, strengthening, with significant focus on eccentric aspects.   Proper technique shown and discussed handout in great detail with ATC.  All questions were discussed and answered.         Other Visit Diagnoses    Somatic dysfunction of thoracic region       Somatic dysfunction of lumbar region       Somatic dysfunction of pelvis region          Follow-up: Return in about 6 weeks (around 02/16/2017).    CMA/ATC served as Education administrator during this visit. History, Physical, and Plan performed by medical provider. Documentation and orders reviewed and attested to.      Teresa Coombs, [facility] Physician

## 2017-01-10 ENCOUNTER — Encounter: Payer: Self-pay | Admitting: Radiation Oncology

## 2017-01-10 NOTE — Progress Notes (Signed)
  Radiation Oncology         [phone] ________________________________  Name: [name] MRN: [id number]  Date: 01/10/2017  DOB: 03-06-1961  End of Treatment Note  Diagnosis:  DCIS of the left upper outer breast, ER/PR-     Indication for treatment:  Curative       Radiation treatment dates:  11/13/16-12/27/16  Site/dose: 1) Left breast/ 50 Gy in 25 fractions   2) Left breast boost/ 14.4 Gy in 8 fractions  Beams/energy:  1) 3D/ 6X    2) Isodose plan/ 6X  Narrative: The patient tolerated radiation treatment relatively well. During treatment, the patient demonstrated hyperpigmentation without desquamation in the treatment field.  Plan: The patient has completed radiation treatment. The patient will return to radiation oncology clinic for routine followup in one month. I advised them to call or return sooner if they have any questions or concerns related to their recovery or treatment.  ------------------------------------------------  Jodelle Gross, MD, PhD  This document serves as a record of services personally performed by Kyung Rudd, MD. It was created on his behalf by Bethann Humble, a trained medical scribe. The creation of this record is based on the scribe's personal observations and the provider's statements to them. This document has been checked and approved by the attending provider.

## 2017-01-11 ENCOUNTER — Telehealth: Payer: Self-pay | Admitting: Family Medicine

## 2017-01-11 NOTE — Telephone Encounter (Signed)
Faxed ROI to [facility] @ [phone] [place]

## 2017-01-25 ENCOUNTER — Encounter: Payer: Self-pay | Admitting: Sports Medicine

## 2017-01-25 DIAGNOSIS — M546 Pain in thoracic spine: Secondary | ICD-10-CM | POA: Insufficient documentation

## 2017-01-25 NOTE — Assessment & Plan Note (Signed)
Pain does seem to be related to musculoskeletal causes and is responding well to a steroid Dosepak. Osteopathic medication performed today with good improvement in her symptoms.  Home exercises reviewed working on core conditioning including thoracic mobility.  PROCEDURE NOTE : OSTEOPATHIC MANIPULATION The decision today to treat with Osteopathic Manipulative Therapy (OMT) was based on physical exam findings. Verbal consent was obtained after after explanation of risks, benefits and potential side effects, including acute pain flare, post manipulation soreness and need for repeat treatments.  If Cervical manipulation was performed additional time was spent discussing the associated minimal risk of  injury to neurovascular structures.  After consent was obtained manipulation was performed as below:            Regions treated:  Per billing codes          Techniques used:  Direct, Muscle Energy, MFR and HVLA The patient tolerated the treatment well and reported Improved symptoms following treatment today. Patient was given medications, exercises, stretches and lifestyle modifications per AVS and verbally.     +++++++++++++++++++++++++++++++++++++++++++++++++++++++++++++++ PROCEDURE NOTE: THERAPEUTIC EXERCISES (97110) 15 minutes spent for Therapeutic exercises as stated in above notes.  This included exercises focusing on stretching, strengthening, with significant focus on eccentric aspects.   Proper technique shown and discussed handout in great detail with ATC.  All questions were discussed and answered.

## 2017-01-30 ENCOUNTER — Ambulatory Visit: Payer: Self-pay | Admitting: Radiation Oncology

## 2017-02-06 ENCOUNTER — Telehealth: Payer: Self-pay | Admitting: Physician Assistant

## 2017-02-06 NOTE — Telephone Encounter (Signed)
Noted. Samantha aware.

## 2017-02-06 NOTE — Telephone Encounter (Signed)
Patient called to advise that she still has a rx through [employer] [facility] that she discussed with Lanny Hurst and is just going to use it there. She will figure out getting the rx from here at another time. No further action needed. Call ended before getting the name of the rx.

## 2017-02-08 ENCOUNTER — Telehealth: Payer: Self-pay | Admitting: Physician Assistant

## 2017-02-08 NOTE — Telephone Encounter (Signed)
Re-fax ROI to [facility] [place]

## 2017-02-12 ENCOUNTER — Encounter: Payer: Self-pay | Admitting: Adult Health

## 2017-02-12 NOTE — Telephone Encounter (Signed)
Rec'd from [place] @ [place] forward 21 pages to [employer] PA

## 2017-02-12 NOTE — Telephone Encounter (Deleted)
Rec'd from [place] forwarded 21 pages to [facility] PA

## 2017-02-13 ENCOUNTER — Ambulatory Visit: Payer: Self-pay | Admitting: Radiation Oncology

## 2017-02-16 ENCOUNTER — Ambulatory Visit: Payer: Self-pay | Admitting: Sports Medicine

## 2017-02-19 ENCOUNTER — Encounter: Payer: Self-pay | Admitting: Sports Medicine

## 2017-02-19 ENCOUNTER — Ambulatory Visit (INDEPENDENT_AMBULATORY_CARE_PROVIDER_SITE_OTHER): Payer: Self-pay | Admitting: Sports Medicine

## 2017-02-19 VITALS — BP 102/76 | HR 83 | Ht 67.0 in | Wt 145.0 lb

## 2017-02-19 DIAGNOSIS — M546 Pain in thoracic spine: Secondary | ICD-10-CM

## 2017-02-19 NOTE — Progress Notes (Signed)
OFFICE VISIT NOTE Alyssa Riley. Birch [place], [place] [facility] [place] - 56 y.o. female MRN [id number]  Date of birth: 07-Oct-1960  Visit Date: 02/19/2017  PCP: Inda Coke, PA   Referred by: Inda Coke, PA  [place], [place] acting as scribe for Dr. Paulla Fore.  SUBJECTIVE:   Chief Complaint  Patient presents with  . Follow-up    t-spine pain   HPI: As below and per problem based documentation when appropriate.  Pt presents today in follow-up of thoracic spine pain. Pt reports improvement in back pain since her last visit. She is doing home exercises and benefiting from this. She does feel some discomfort when doing the "cobra". She has finished her Medrol Dospak and is not longer taking Flexeril. She does not currently use ice or heat on her back. She doesn't feel like she needs OMT today but will discuss this further with Dr. Paulla Fore. She denies discomfort after the last OMT visit.   Pt denies fever, chills, night sweats, unintentional weight gain or loss.     Review of Systems  Constitutional: Negative for chills and fever.  Respiratory: Negative for shortness of breath and wheezing.   Cardiovascular: Positive for leg swelling (ankles). Negative for chest pain and palpitations.  Musculoskeletal: Negative for back pain and falls.  Neurological: Negative for dizziness, tingling and headaches.  Endo/Heme/Allergies: Does not bruise/bleed easily.    Otherwise per HPI.  HISTORY & PERTINENT PRIOR DATA:  No specialty comments available. She reports that she has never smoked. She has never used smokeless tobacco. No results for input(s): HGBA1C, LABURIC in the last 8760 hours. Medications & Allergies reviewed per EMR Patient Active Problem List   Diagnosis Date Noted  . Thoracic back pain 01/25/2017  . DCIS left breast; ER negative 10/24/2016   Past Medical History:  Diagnosis Date  . Breast cancer ([place])  09/13/2016   left breast   . DVT (deep venous thrombosis) ([place]) 06/2016   Left calf  . Factor 5 Leiden mutation, heterozygous ([place])    No family history on file. Past Surgical History:  Procedure Laterality Date  . BREAST LUMPECTOMY WITH RADIOACTIVE SEED LOCALIZATION Left 10/10/2016   Procedure: LEFT BREAST LUMPECTOMY WITH RADIOACTIVE SEED LOCALIZATION;  Surgeon: Donnie Mesa, MD;  Location: [facility];  Service: General;  Laterality: Left;  . DILATION AND CURETTAGE OF UTERUS  1998  . FRACTURE SURGERY Left    foot at age 19   Social History   Occupational History  . Not on file.   Social History Main Topics  . Smoking status: Never Smoker  . Smokeless tobacco: Never Used  . Alcohol use Yes     Comment: social   . Drug use: No  . Sexual activity: Yes     Comment: vasectomy    OBJECTIVE:  VS:  HT:5\' 7"  (170.2 cm)   WT:145 lb (65.8 kg)  BMI:22.8    BP:102/76  HR:83bpm  TEMP: ( )  RESP:97 % EXAM: Findings:  Back is overall well aligned.  She has good thoracic excursion.  No focal TTP.  No significant scoliosis.  No palpable masses.     No results found. ASSESSMENT & PLAN:   Problem List Items Addressed This Visit    Thoracic back pain - Primary    Doing quite well.  Patient is not interested in repeat manipulation at this time.  She is performing her therapeutic exercises and doing well  with these.  Patient follow-up if any recurrent symptoms.         Follow-up: Return if symptoms worsen or fail to improve.   CMA/ATC served as Education administrator during this visit. History, Physical, and Plan performed by medical provider. Documentation and orders reviewed and attested to.      Teresa Coombs, [facility] Physician

## 2017-03-04 NOTE — Assessment & Plan Note (Signed)
Doing quite well.  Patient is not interested in repeat manipulation at this time.  She is performing her therapeutic exercises and doing well with these.  Patient follow-up if any recurrent symptoms.

## 2017-03-21 ENCOUNTER — Telehealth: Payer: Self-pay | Admitting: Adult Health

## 2017-03-21 ENCOUNTER — Encounter: Payer: Self-pay | Admitting: Radiation Oncology

## 2017-03-21 ENCOUNTER — Ambulatory Visit
Admission: RE | Admit: 2017-03-21 | Discharge: 2017-03-21 | Disposition: A | Discharge status: Home / Self Care | Payer: Self-pay | Source: Ambulatory Visit | Attending: Radiation Oncology | Admitting: Radiation Oncology

## 2017-03-21 VITALS — BP 108/58 | HR 67 | Temp 98.5°F | Resp 18 | Ht 67.0 in | Wt 144.6 lb

## 2017-03-21 DIAGNOSIS — Z79899 Other long term (current) drug therapy: Secondary | ICD-10-CM | POA: Insufficient documentation

## 2017-03-21 DIAGNOSIS — D0582 Other specified type of carcinoma in situ of left breast: Secondary | ICD-10-CM | POA: Insufficient documentation

## 2017-03-21 DIAGNOSIS — C50412 Malignant neoplasm of upper-outer quadrant of left female breast: Secondary | ICD-10-CM

## 2017-03-21 DIAGNOSIS — Z171 Estrogen receptor negative status [ER-]: Secondary | ICD-10-CM

## 2017-03-21 NOTE — Telephone Encounter (Signed)
Scheduled SCP appt per 7/18 sch message - sent reminder letter in the mail for patient

## 2017-03-21 NOTE — Progress Notes (Signed)
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  Interval Since Last Radiation:  12 weeks   2/18-04/25/18 1.  Left breast/ 50 Gy in 25 fractions 2.  Left breast boost/ 14.4 Gy in 8 fractions  Narrative:  The patient returns today for routine follow-up. During treatment she did very well with radiotherapy and did not have significant desquamation.                          On review of systems, the patient states she is doing well since completing treatment. She has plans to follow up with Dr. Gershon Crane and will coordinate mammograms either with him or her primary care provider. She reports she is not interested in meeting back with medical oncology or with [place] for follow ups given the cost of visits. She denies any concerns currently with her skin. She continues to exercise regularly but noted that she has had tightness in her left axillary/pectoral region since completing treatment. She denies any frank pain in this site. No other complaints are noted.  ALLERGIES:  has No Known Allergies.  Meds: Current Outpatient Prescriptions  Medication Sig Dispense Refill  . b complex vitamins tablet Take 1 tablet by mouth daily.    . Calcium Carbonate-Vit D-Min (CALCIUM 1200 PO) Take by mouth.    Marland Kitchen ibuprofen (ADVIL,MOTRIN) 200 MG tablet Take 400 mg by mouth every 4 (four) hours as needed.    . Multiple Vitamin (MULTIVITAMIN) capsule Take 1 capsule by mouth daily.    . Turmeric 500 MG TABS Take 500 mg by mouth 2 (two) times daily.    . vitamin C (ASCORBIC ACID) 500 MG tablet Take 500 mg by mouth daily.     No current facility-administered medications for this encounter.     Physical Findings:  height is 5\' 7"  (1.702 m) and weight is 144 lb 9.6 oz (65.6 kg). Her oral  temperature is 98.5 F (36.9 C). Her blood pressure is 108/58 (abnormal) and her pulse is 67. Her respiration is 18 and oxygen saturation is 97%.  Pain Assessment Pain Score: 0-No pain/10 In general this is a well appearing caucasian female in no acute distress. She's alert and oriented x4 and appropriate throughout the examination. Cardiopulmonary assessment is negative for acute distress and she exhibits normal effort. The left breast was examined and reveals no desquamation, fullness or fluid accumulation along the chest wall or about the lumpectomy site. She appears to have full range of motion of her LUE.   Lab Findings: Lab Results  Component Value Date   HGB 12.6 06/18/2016   HCT 37.0 06/18/2016     Radiographic Findings: No results found.  Impression/Plan: 1. 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 The patient has been doing well since completion of radiotherapy. We discussed that we would be happy to continue to follow her as needed, but she will also continue to follow up with Dr. Georgette Dover. She was counseled on skin care as well as measures to avoid sun exposure to this area.  2. Tightness in the pectoral region. The patient is routinely exercising, and performing stretching exercises at home. She is offered an evaluation with PT, but at current, is pleased to  follow this expectantly.     Carola Rhine, PAC

## 2017-03-21 NOTE — Addendum Note (Signed)
Encounter addended by: Malena Edman, RN on: 03/21/2017  3:31 PM<BR>    Actions taken: Charge Capture section accepted

## 2017-08-10 ENCOUNTER — Ambulatory Visit: Payer: Self-pay | Admitting: Family Medicine

## 2017-08-10 ENCOUNTER — Encounter: Payer: Self-pay | Admitting: Family Medicine

## 2017-08-10 VITALS — BP 112/78 | HR 82 | Temp 97.8°F | Ht 67.0 in | Wt 147.6 lb

## 2017-08-10 DIAGNOSIS — M546 Pain in thoracic spine: Secondary | ICD-10-CM

## 2017-08-10 DIAGNOSIS — J029 Acute pharyngitis, unspecified: Secondary | ICD-10-CM

## 2017-08-10 DIAGNOSIS — M545 Low back pain: Secondary | ICD-10-CM

## 2017-08-10 LAB — POCT RAPID STREP A (OFFICE): Rapid Strep A Screen: NEGATIVE

## 2017-08-10 MED ORDER — METHYLPREDNISOLONE 4 MG PO TBPK: ORAL_TABLET | ORAL | 0 refills | Status: DC

## 2017-08-10 NOTE — Progress Notes (Signed)
Subjective:  Alyssa Riley is a 56 y.o. year old very pleasant female patient who presents for/with See problem oriented charting ROS- no leg weakness, no fecal or urinary incontinence, paresthesias, sciatica symptoms   Past Medical History-  Patient Active Problem List   Diagnosis Date Noted  . Thoracic back pain 01/25/2017  . DCIS left breast; ER negative 10/24/2016    Medications- reviewed and updated Current Outpatient Medications  Medication Sig Dispense Refill  . b complex vitamins tablet Take 1 tablet by mouth daily.    . Calcium Carbonate-Vit D-Min (CALCIUM 1200 PO) Take by mouth.    Marland Kitchen ibuprofen (ADVIL,MOTRIN) 200 MG tablet Take 400 mg by mouth every 4 (four) hours as needed.    . Multiple Vitamin (MULTIVITAMIN) capsule Take 1 capsule by mouth daily.    . Turmeric 500 MG TABS Take 500 mg by mouth 2 (two) times daily.    . vitamin C (ASCORBIC ACID) 500 MG tablet Take 500 mg by mouth daily.     Objective: BP 112/78 (BP Location: Left Arm, Patient Position: Sitting, Cuff Size: Normal)   Pulse 82   Temp 97.8 F (36.6 C) (Oral)   Ht 5\' 7"  (1.702 m)   Wt 147 lb 9.6 oz (67 kg)   SpO2 98%   BMI 23.12 kg/m  Gen: NAD, resting comfortably Erythematous pharynx without tonsilar enlargement or exudate. Clear nasal discharge noted.  CV: RRR no murmurs rubs or gallops Lungs: CTAB no crackles, wheeze, rhonchi Ext: no edema Skin: warm, dry  Back - Normal skin, Spine with normal alignment and no deformity.  No tenderness to vertebral process palpation.  Paraspinous muscles are tender and with spasm from mid back (thoracic) down to lumbar spine.   Range of motion is full at neck and lumbar sacral regions.  Neuro- no saddle anesthesia, 5/5 strength lower extremities  Results for orders placed or performed in visit on 08/10/17 (from the past 24 hour(s))  POCT rapid strep A     Status: None   Collection Time: 08/10/17 10:20 AM  Result Value Ref Range   Rapid Strep A Screen Negative  Negative   Assessment/Plan:  Acute bilateral thoracic back pain  Acute bilateral low back pain, with sciatica presence unspecified  Sore throat - Plan: POCT rapid strep A S:  back pain has flared up again. Similar to what happened in the spring. In [facility] Sunday to Wednesday- slipped on wet floor while there on Sunday- hit buttocks not very hard- feels like hurt her wrist worse than back. This Wednesday got up and back seized up on her. Had some leftover flexeril and that has been helping. Helps some but wakes up and still tight so wanted to be evaluated. 4-5/10 pain- inconvenience for moving around. Sporadically still doing her back strengthening exercises- does tend to stretch nightly and had not done that in [place]. Also did a tone of walking in [place].   Woke up with sore throat on Monday morning and continues. No fever. No sore throat in years. Some nasal congestion. Sporadic cough. Minimal nasal discharge. Husband and son sick for weeks.  A/P: Appears to have flare of thoracic back pain likely triggered by fall and then high level of walking for several days. Very responsive to medrol dose pack in the past- may take this and if not improved follow up with Dr. Paulla Fore as she has in the past  Sore throat likely from URI. Strep normal- discussed 7-14 days likely of symptoms. Medrol dose pack  may help the sore throat.   Future Appointments  Date Time Provider [place]  09/21/2017 10:00 AM Causey, Charlestine Massed, NP [facility] None   Orders Placed This Encounter  Procedures  . POCT rapid strep A    Meds ordered this encounter  Medications  . methylPREDNISolone (MEDROL DOSEPAK) 4 MG TBPK tablet    Sig: 6-5-4-3-2-1-off    Dispense:  21 tablet    Refill:  0   Return precautions advised for both sore throat/URI and thoracic/lumbar back pain Garret Reddish, MD

## 2017-08-10 NOTE — Patient Instructions (Signed)
Retrial medrol dose pack  After this has calmed down- resume your regular exercises/stretching  Can get you into Dr. Paulla Fore if need be. Should not have drastically worsening symptoms at this point or fever or weakness in legs or numbness/tingling in legs or bladder or stool issues  This dose pack may also help with the upper respiratory infection at least the inflammation from the sore throat. See [place] back if not improving within 10 days or if symptoms worsen including shortness of breath

## 2017-08-20 ENCOUNTER — Other Ambulatory Visit: Payer: Self-pay | Admitting: Surgery

## 2017-08-20 DIAGNOSIS — Z853 Personal history of malignant neoplasm of breast: Secondary | ICD-10-CM

## 2017-09-05 ENCOUNTER — Ambulatory Visit
Admission: RE | Admit: 2017-09-05 | Discharge: 2017-09-05 | Disposition: A | Discharge status: Home / Self Care | Payer: No Typology Code available for payment source | Source: Ambulatory Visit | Attending: Surgery | Admitting: Surgery

## 2017-09-05 DIAGNOSIS — Z853 Personal history of malignant neoplasm of breast: Secondary | ICD-10-CM

## 2017-09-05 HISTORY — DX: Personal history of irradiation: Z92.3

## 2017-09-05 LAB — HM MAMMOGRAPHY

## 2017-09-21 ENCOUNTER — Encounter: Payer: Self-pay | Admitting: Adult Health

## 2017-10-02 ENCOUNTER — Other Ambulatory Visit: Payer: Self-pay | Admitting: Physician Assistant

## 2017-10-02 ENCOUNTER — Telehealth: Payer: Self-pay | Admitting: Physician Assistant

## 2017-10-02 ENCOUNTER — Other Ambulatory Visit: Payer: Self-pay | Admitting: Family Medicine

## 2017-10-02 NOTE — Telephone Encounter (Addendum)
Spoke with patient and she stated that she only takes the Xarelto when she fly's due to having blood clots in the past. Patient does not know the dose that she takes so she will call her pharmacy and call back with the dose. She said that she only needs 4 tablets. Patient stated that a hematologist has diagnosed her with this. Please advise on the refill. Patient stated that she is a self pay patient.

## 2017-10-02 NOTE — Telephone Encounter (Signed)
Left message for patient that she would need an appointment to discuss this. I also left message that if she didn't want to come in she may try to call her Hematologist to get a prescription. I left our phone number to call and schedule if she needed to schedule.

## 2017-10-02 NOTE — Telephone Encounter (Signed)
Copied from [place] [id number]. Topic: General - Other >> Oct 02, 2017  5:05 PM Conception Chancy, NT wrote: Patient is going out of town on a airplane 10/18/17 and she is requesting Xarelto. She is a self pay patient and would like to have her physical done at the same time she comes in to get her medication since she has to be seen prior to flying. Inda Coke is her PCP. Please contact patient to schedule and if its okay for Briscoe Deutscher to do her physical without being patients PCP.

## 2017-10-02 NOTE — Telephone Encounter (Signed)
Please advise on refill.

## 2017-10-02 NOTE — Telephone Encounter (Signed)
She would need an appointment to discuss. She may try calling her Hematologist with the request.

## 2017-10-02 NOTE — Telephone Encounter (Signed)
Copied from [place] [phone]. Topic: Quick Communication - Rx Refill/Question >> Oct 02, 2017 11:45 AM Oliver Pila B wrote: Medication: Xarelto  Pt states the [facility] in [place] on [address] has sent a request to the practice for the Rx to be filled, contact pt or pharmacy if needed

## 2017-10-03 ENCOUNTER — Other Ambulatory Visit: Payer: Self-pay

## 2017-10-04 ENCOUNTER — Encounter: Payer: Self-pay | Admitting: Physician Assistant

## 2017-10-16 ENCOUNTER — Ambulatory Visit (INDEPENDENT_AMBULATORY_CARE_PROVIDER_SITE_OTHER): Payer: Self-pay

## 2017-10-16 ENCOUNTER — Encounter: Payer: Self-pay | Admitting: Physician Assistant

## 2017-10-16 ENCOUNTER — Other Ambulatory Visit (HOSPITAL_COMMUNITY)
Admission: RE | Admit: 2017-10-16 | Discharge: 2017-10-16 | Disposition: A | Discharge status: Home / Self Care | Payer: Self-pay | Source: Ambulatory Visit | Attending: Physician Assistant | Admitting: Physician Assistant

## 2017-10-16 ENCOUNTER — Ambulatory Visit: Payer: No Typology Code available for payment source | Admitting: Physician Assistant

## 2017-10-16 VITALS — BP 120/78 | HR 76 | Temp 97.8°F | Resp 16 | Ht 67.0 in | Wt 145.6 lb

## 2017-10-16 DIAGNOSIS — Z171 Estrogen receptor negative status [ER-]: Secondary | ICD-10-CM

## 2017-10-16 DIAGNOSIS — Z124 Encounter for screening for malignant neoplasm of cervix: Secondary | ICD-10-CM | POA: Insufficient documentation

## 2017-10-16 DIAGNOSIS — M25531 Pain in right wrist: Secondary | ICD-10-CM

## 2017-10-16 DIAGNOSIS — D6851 Activated protein C resistance: Secondary | ICD-10-CM

## 2017-10-16 DIAGNOSIS — Z0001 Encounter for general adult medical examination with abnormal findings: Secondary | ICD-10-CM

## 2017-10-16 DIAGNOSIS — C50412 Malignant neoplasm of upper-outer quadrant of left female breast: Secondary | ICD-10-CM

## 2017-10-16 DIAGNOSIS — Z853 Personal history of malignant neoplasm of breast: Secondary | ICD-10-CM

## 2017-10-16 DIAGNOSIS — Z86718 Personal history of other venous thrombosis and embolism: Secondary | ICD-10-CM

## 2017-10-16 MED ORDER — RIVAROXABAN 15 MG PO TABS: 15.0000 mg | ORAL_TABLET | Freq: Two times a day (BID) | ORAL | 0 refills | Status: DC

## 2017-10-16 NOTE — Patient Instructions (Signed)
It was great to see you!  Consider calling the Health Department, [facility] or a Pharmacy to ask about your tetanus vaccine.  Make an appointment with Dr. Paulla Fore in 2 weeks. Use a wrist brace until that time. Avoid tennis until cleared by him.  I recommend that you take 15 mg xarelto twice daily for first 21 days and 20 mg once daily thereafter.  Please make an appointment with the lab on your way out. I would like for you to return for lab work within 1-2 weeks. After midnight on the day of the lab draw, please do not eat anything. You may have water, black coffee, unsweetened tea.  Health Maintenance, Female Adopting a healthy lifestyle and getting preventive care can go a long way to promote health and wellness. Talk with your health care provider about what schedule of regular examinations is right for you. This is a good chance for you to check in with your provider about disease prevention and staying healthy. In between checkups, there are plenty of things you can do on your own. Experts have done a lot of research about which lifestyle changes and preventive measures are most likely to keep you healthy. Ask your health care provider for more information. Weight and diet Eat a healthy diet  Be sure to include plenty of vegetables, fruits, low-fat dairy products, and lean protein.  Do not eat a lot of foods high in solid fats, added sugars, or salt.  Get regular exercise. This is one of the most important things you can do for your health. ? Most adults should exercise for at least 150 minutes each week. The exercise should increase your heart rate and make you sweat (moderate-intensity exercise). ? Most adults should also do strengthening exercises at least twice a week. This is in addition to the moderate-intensity exercise.  Maintain a healthy weight  Body mass index (BMI) is a measurement that can be used to identify possible weight problems. It estimates body fat  based on height and weight. Your health care provider can help determine your BMI and help you achieve or maintain a healthy weight.  For females 78 years of age and older: ? A BMI below 18.5 is considered underweight. ? A BMI of 18.5 to 24.9 is normal. ? A BMI of 25 to 29.9 is considered overweight. ? A BMI of 30 and above is considered obese.  Watch levels of cholesterol and blood lipids  You should start having your blood tested for lipids and cholesterol at 55 years of age, then have this test every 5 years.  You may need to have your cholesterol levels checked more often if: ? Your lipid or cholesterol levels are high. ? You are older than 55 years of age. ? You are at high risk for heart disease.  Cancer screening Lung Cancer  Lung cancer screening is recommended for adults 50-50 years old who are at high risk for lung cancer because of a history of smoking.  A yearly low-dose CT scan of the lungs is recommended for people who: ? Currently smoke. ? Have quit within the past 15 years. ? Have at least a 30-pack-year history of smoking. A pack year is smoking an average of one pack of cigarettes a day for 1 year.  Yearly screening should continue until it has been 15 years since you quit.  Yearly screening should stop if you develop a health problem that would prevent you from having lung cancer treatment.  Breast Cancer  Practice breast self-awareness. This means understanding how your breasts normally appear and feel.  It also means doing regular breast self-exams. Let your health care provider know about any changes, no matter how small.  If you are in your 20s or 30s, you should have a clinical breast exam (CBE) by a health care provider every 1-3 years as part of a regular health exam.  If you are 57 or older, have a CBE every year. Also consider having a breast X-ray (mammogram) every year.  If you have a family history of breast cancer, talk to your health care  provider about genetic screening.  If you are at high risk for breast cancer, talk to your health care provider about having an MRI and a mammogram every year.  Breast cancer gene (BRCA) assessment is recommended for women who have family members with BRCA-related cancers. BRCA-related cancers include: ? Breast. ? Ovarian. ? Tubal. ? Peritoneal cancers.  Results of the assessment will determine the need for genetic counseling and BRCA1 and BRCA2 testing.  Cervical Cancer Your health care provider may recommend that you be screened regularly for cancer of the pelvic organs (ovaries, uterus, and vagina). This screening involves a pelvic examination, including checking for microscopic changes to the surface of your cervix (Pap test). You may be encouraged to have this screening done every 3 years, beginning at age 1.  For women ages 47-65, health care providers may recommend pelvic exams and Pap testing every 3 years, or they may recommend the Pap and pelvic exam, combined with testing for human papilloma virus (HPV), every 5 years. Some types of HPV increase your risk of cervical cancer. Testing for HPV may also be done on women of any age with unclear Pap test results.  Other health care providers may not recommend any screening for nonpregnant women who are considered low risk for pelvic cancer and who do not have symptoms. Ask your health care provider if a screening pelvic exam is right for you.  If you have had past treatment for cervical cancer or a condition that could lead to cancer, you need Pap tests and screening for cancer for at least 20 years after your treatment. If Pap tests have been discontinued, your risk factors (such as having a new sexual partner) need to be reassessed to determine if screening should resume. Some women have medical problems that increase the chance of getting cervical cancer. In these cases, your health care provider may recommend more frequent screening and  Pap tests.  Colorectal Cancer  This type of cancer can be detected and often prevented.  Routine colorectal cancer screening usually begins at 55 years of age and continues through 55 years of age.  Your health care provider may recommend screening at an earlier age if you have risk factors for colon cancer.  Your health care provider may also recommend using home test kits to check for hidden blood in the stool.  A small camera at the end of a tube can be used to examine your colon directly (sigmoidoscopy or colonoscopy). This is done to check for the earliest forms of colorectal cancer.  Routine screening usually begins at age 36.  Direct examination of the colon should be repeated every 5-10 years through 55 years of age. However, you may need to be screened more often if early forms of precancerous polyps or small growths are found.  Skin Cancer  Check your skin from head to toe regularly.  Tell  your health care provider about any new moles or changes in moles, especially if there is a change in a mole's shape or color.  Also tell your health care provider if you have a mole that is larger than the size of a pencil eraser.  Always use sunscreen. Apply sunscreen liberally and repeatedly throughout the day.  Protect yourself by wearing long sleeves, pants, a wide-brimmed hat, and sunglasses whenever you are outside.  Heart disease, diabetes, and high blood pressure  High blood pressure causes heart disease and increases the risk of stroke. High blood pressure is more likely to develop in: ? People who have blood pressure in the high end of the normal range (130-139/85-89 mm Hg). ? People who are overweight or obese. ? People who are African American.  If you are 23-72 years of age, have your blood pressure checked every 3-5 years. If you are 50 years of age or older, have your blood pressure checked every year. You should have your blood pressure measured twice-once when you  are at a hospital or clinic, and once when you are not at a hospital or clinic. Record the average of the two measurements. To check your blood pressure when you are not at a hospital or clinic, you can use: ? An automated blood pressure machine at a pharmacy. ? A home blood pressure monitor.  If you are between 47 years and 15 years old, ask your health care provider if you should take aspirin to prevent strokes.  Have regular diabetes screenings. This involves taking a blood sample to check your fasting blood sugar level. ? If you are at a normal weight and have a low risk for diabetes, have this test once every three years after 55 years of age. ? If you are overweight and have a high risk for diabetes, consider being tested at a younger age or more often. Preventing infection Hepatitis B  If you have a higher risk for hepatitis B, you should be screened for this virus. You are considered at high risk for hepatitis B if: ? You were born in a country where hepatitis B is common. Ask your health care provider which countries are considered high risk. ? Your parents were born in a high-risk country, and you have not been immunized against hepatitis B (hepatitis B vaccine). ? You have HIV or AIDS. ? You use needles to inject street drugs. ? You live with someone who has hepatitis B. ? You have had sex with someone who has hepatitis B. ? You get hemodialysis treatment. ? You take certain medicines for conditions, including cancer, organ transplantation, and autoimmune conditions.  Hepatitis C  Blood testing is recommended for: ? Everyone born from [birth year] through 1963. ? Anyone with known risk factors for hepatitis C.  Sexually transmitted infections (STIs)  You should be screened for sexually transmitted infections (STIs) including gonorrhea and chlamydia if: ? You are sexually active and are younger than 55 years of age. ? You are older than 55 years of age and your health care provider  tells you that you are at risk for this type of infection. ? Your sexual activity has changed since you were last screened and you are at an increased risk for chlamydia or gonorrhea. Ask your health care provider if you are at risk.  If you do not have HIV, but are at risk, it may be recommended that you take a prescription medicine daily to prevent HIV infection. This is called pre-exposure  prophylaxis (PrEP). You are considered at risk if: ? You are sexually active and do not regularly use condoms or know the HIV status of your partner(s). ? You take drugs by injection. ? You are sexually active with a partner who has HIV.  Talk with your health care provider about whether you are at high risk of being infected with HIV. If you choose to begin PrEP, you should first be tested for HIV. You should then be tested every 3 months for as long as you are taking PrEP. Pregnancy  If you are premenopausal and you may become pregnant, ask your health care provider about preconception counseling.  If you may become pregnant, take 400 to 800 micrograms (mcg) of folic acid every day.  If you want to prevent pregnancy, talk to your health care provider about birth control (contraception). Osteoporosis and menopause  Osteoporosis is a disease in which the bones lose minerals and strength with aging. This can result in serious bone fractures. Your risk for osteoporosis can be identified using a bone density scan.  If you are 67 years of age or older, or if you are at risk for osteoporosis and fractures, ask your health care provider if you should be screened.  Ask your health care provider whether you should take a calcium or vitamin D supplement to lower your risk for osteoporosis.  Menopause may have certain physical symptoms and risks.  Hormone replacement therapy may reduce some of these symptoms and risks. Talk to your health care provider about whether hormone replacement therapy is right for  you. Follow these instructions at home:  Schedule regular health, dental, and eye exams.  Stay current with your immunizations.  Do not use any tobacco products including cigarettes, chewing tobacco, or electronic cigarettes.  If you are pregnant, do not drink alcohol.  If you are breastfeeding, limit how much and how often you drink alcohol.  Limit alcohol intake to no more than 1 drink per day for nonpregnant women. One drink equals 12 ounces of beer, 5 ounces of wine, or 1 ounces of hard liquor.  Do not use street drugs.  Do not share needles.  Ask your health care provider for help if you need support or information about quitting drugs.  Tell your health care provider if you often feel depressed.  Tell your health care provider if you have ever been abused or do not feel safe at home. This information is not intended to replace advice given to you by your health care provider. Make sure you discuss any questions you have with your health care provider. Document Released: 03/06/2011 Document Revised: 01/27/2016 Document Reviewed: 05/25/2015 Elsevier Interactive Patient Education  [employer].

## 2017-10-16 NOTE — Progress Notes (Signed)
Subjective:    Alyssa Riley is a 57 y.o. female and is here for a comprehensive physical exam.  HPI  There are no preventive care reminders to display for this patient.  Acute Concerns: Right hand pain - started after a fall on December 2nd -- she had a [place] injury while traveling in [place]. Used ACE wrap and ibuprofen at first and then improved with time. States that the pain had gone away after a few days. States that she started playing tennis again on 10/09/17, played 5 days in one week, and the pain has returned. States the pain is worse with certain movements. Vibrating electric toothbrush bothers it. Pain is at the exact point where the base of the racket touches her hand. Pain is when there is impact on the racket. Sometimes has pain with supination. She is R-handed. No numbness and tingling. Did not notice any significant swelling or bruising after initial injury. None currently.  Chronic Issues: Breast cancer -- Radiation completed 12/27/16. Mammogram performed in January.  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 DVT after flight to [place] 20 years ago (was on oral contraceptives at that time) and then a second one 2 years ago (after cross country trip.) No chest pain, no SOB, no calf pain currently. She used to see a hematologist but she doesn't any more. She states that she had "an agreement" with her hematologist that she will take xarelto when flying. She understands the risk of not taking it everyday.   Health Maintenance: Immunizations -- ?tetanus Colonoscopy -- has done cologaurd, she is unsure of when she last did it Mammogram -- Jan 2nd this year PAP -- due for pap smear today, no abnormal to her knowledge Diet -- avoids white carbs, pretty balanced intake of all food groups Caffeine intake -- none Sleep habits -- generally good 7 hours Exercise -- 5 days, very active, likes tennis Weight -- Weight: 145 lb 9.6 oz (66 kg) - stable Mood --  good  Depression screen PHQ 2/9 03/21/2017  Decreased Interest 0  Down, Depressed, Hopeless 0  PHQ - 2 Score 0    Other providers/specialists: Dr. Lindi Adie -- oncology Dr. Lisbeth Renshaw -- radiation Dr. Paulla Fore -- sports medicine   PMHx, SurgHx, SocialHx, Medications, and Allergies were reviewed in the Visit Navigator and updated as appropriate.   Past Medical History:  Diagnosis Date  . Breast cancer ([facility]) 09/13/2016   left breast   . DVT (deep venous thrombosis) ([facility]) 06/2016   Left calf  . Factor 5 Leiden mutation, heterozygous ([place])   . Personal history of radiation therapy 2018     Past Surgical History:  Procedure Laterality Date  . BREAST LUMPECTOMY Left 10/2016  . BREAST LUMPECTOMY WITH RADIOACTIVE SEED LOCALIZATION Left 10/10/2016   Procedure: LEFT BREAST LUMPECTOMY WITH RADIOACTIVE SEED LOCALIZATION;  Surgeon: Donnie Mesa, MD;  Location: [facility];  Service: General;  Laterality: Left;  . DILATION AND CURETTAGE OF UTERUS  1998  . FRACTURE SURGERY Left    foot at age 24    History reviewed. No pertinent family history.  Social History   Tobacco Use  . Smoking status: Never Smoker  . Smokeless tobacco: Never Used  Substance Use Topics  . Alcohol use: Yes    Comment: social   . Drug use: No    Review of Systems:   Review of Systems  Constitutional: Negative for chills, fever, malaise/fatigue and weight  loss.  HENT: Negative for hearing loss, sinus pain and sore throat.   Respiratory: Negative for cough and hemoptysis.   Cardiovascular: Negative for chest pain, palpitations, leg swelling and PND.  Gastrointestinal: Negative for abdominal pain, constipation, diarrhea, heartburn, nausea and vomiting.  Genitourinary: Negative for dysuria, frequency and urgency.  Musculoskeletal: Positive for joint pain (R wrist). Negative for back pain, myalgias and neck pain.  Skin: Negative for itching and rash.  Neurological: Negative for dizziness, tingling,  seizures and headaches.  Endo/Heme/Allergies: Negative for polydipsia.  Psychiatric/Behavioral: Negative for depression. The patient is not nervous/anxious.     Objective:   BP 120/78 (BP Location: Right Arm, Patient Position: Sitting, Cuff Size: Normal)   Pulse 76   Temp 97.8 F (36.6 C) (Oral)   Resp 16   Ht 5\' 7"  (1.702 m)   Wt 145 lb 9.6 oz (66 kg)   SpO2 98%   BMI 22.80 kg/m  Body mass index is 22.8 kg/m.   General Appearance:    Alert, cooperative, no distress, appears stated age  Head:    Normocephalic, without obvious abnormality, atraumatic  Eyes:    PERRL, conjunctiva/corneas clear, EOM's intact, fundi    benign, both eyes  Ears:    Normal TM's and external ear canals, both ears  Nose:   Nares normal, septum midline, mucosa normal, no drainage    or sinus tenderness  Throat:   Lips, mucosa, and tongue normal; teeth and gums normal  Neck:   Supple, symmetrical, trachea midline, no adenopathy;    thyroid:  no enlargement/tenderness/nodules; no carotid   bruit or JVD  Back:     Symmetric, no curvature, ROM normal, no CVA tenderness  Lungs:     Clear to auscultation bilaterally, respirations unlabored  Chest Wall:    No tenderness or deformity   Heart:    Regular rate and rhythm, S1 and S2 normal, no murmur, rub   or gallop  Breast Exam:    No tenderness, masses, or nipple abnormality  Abdomen:     Soft, non-tender, bowel sounds active all four quadrants,    no masses, no organomegaly  Genitalia:    Normal female without lesion, discharge or tenderness  Extremities:   Extremities normal, atraumatic, no cyanosis or edema   No decreased ROM of R wrist with passive and resisted movement, unable to reproduce symptoms. Grip strength 5/5. No swelling, ecchymosis noted.   Pulses:   2+ and symmetric all extremities  Skin:   Skin color, texture, turgor normal, no rashes or lesions  Lymph nodes:   Cervical, supraclavicular, and axillary nodes normal  Neurologic:   CNII-XII  intact, normal strength, sensation and reflexes    throughout   CLINICAL DATA:  Medial right wrist pain since a fall two months ago. The patient's symptoms had resolved but have recurred recently while playing tennis.  EXAM: RIGHT WRIST - COMPLETE 3+ VIEW  COMPARISON:  None in PACs  FINDINGS: The bones are subjectively adequately mineralized. There is no acute or healing fracture. The radiocarpal and ulnocarpal and intercarpal joint spaces are well-maintained. The carpometacarpal joints appear normal. Specific attention to the scaphoid reveals no acute abnormality.  IMPRESSION: There is no acute or significant chronic bony abnormality of the right wrist.   Electronically Signed   By: David  Martinique M.D.   On: 10/16/2017 12:07  Assessment/Plan:   Matasha was seen today for annual exam and hand pain.  Diagnoses and all orders for this visit:  Encounter for general  adult medical examination with abnormal findings Today patient counseled on age appropriate routine health concerns for screening and prevention, each reviewed and up to date or declined. Immunizations reviewed and up to date or declined. Labs ordered and reviewed. Risk factors for depression reviewed and negative. Hearing function and visual acuity are intact. ADLs screened and addressed as needed. Functional ability and level of safety reviewed and appropriate. Education, counseling and referrals performed based on assessed risks today. Patient provided with a copy of personalized plan for preventive services. Patient plans to return for labs. She is self-pay and she understands that there will be a cost associated with these labs. We reviewed these labs and these are the ones that she is requesting. -     Lipid panel; Future -     CBC with Differential/Platelet; Future -     Comprehensive metabolic panel; Future -     TSH; Future  Cervical cancer screening Pap completed and collected today. -     Cytology -  PAP  Wrist pain, acute, right Unable to reproduce symptoms and exam. Briefly discussed case with Dr. Teresa Coombs in office. I completed a complete wrist x-ray, which did not find any acute abnormalities. However given her symptoms, I recommended that she use a wrist brace and avoid tennis until she sees Dr. Paulla Fore in approximately 2 weeks. Patient is agreeable to plan. -     DG Wrist Complete Right; Future  Factor V Leiden and Hx of DVT Patient reports that she no longer sees her hematologist. She is aware of the risks of not taking anticoagulation every day. She states that she does take anticoagulation, Xarelto, when she flies. She is planning to go to [place] later this week. I recommended that she take 15 mg twice daily as well as her also for the first 21 days and on day 22 transition to 20 mg once daily. It is my recommendation that she stay on Xarelto daily. Patient reports that she is not a "medication" person and does not plan to do this. Again, patient verbalized understanding of not being on long-term anticoagulation given her history. She was given samples in our office today.  Verbal order received for patient to receive sample medication. Patient teaching completed by provider.    Well Adult Exam: Labs ordered: Yes. Patient counseling was done. See below for items discussed. Discussed the patient's BMI.  The BMI BMI is in the acceptable range Follow up in 2 weeks with Dr. Paulla Fore for R wrist. Breast cancer screening: UTD. Cervical cancer screening: performed today   Patient Counseling: [x]    Nutrition: Stressed importance of moderation in sodium/caffeine intake, saturated fat and cholesterol, caloric balance, sufficient intake of fresh fruits, vegetables, fiber, calcium, iron, and 1 mg of folate supplement per day (for females capable of pregnancy).  [x]    Stressed the importance of regular exercise.   [x]    Substance Abuse: Discussed cessation/primary prevention of tobacco, alcohol,  or other drug use; driving or other dangerous activities under the influence; availability of treatment for abuse.   [x]    Injury prevention: Discussed safety belts, safety helmets, smoke detector, smoking near bedding or upholstery.   [x]    Sexuality: Discussed sexually transmitted diseases, partner selection, use of condoms, avoidance of unintended pregnancy  and contraceptive alternatives.  [x]    Dental health: Discussed importance of regular tooth brushing, flossing, and dental visits.  [x]    Health maintenance and immunizations reviewed. Please refer to Health maintenance section.   RN  served as Education administrator during this visit. History, Physical, and Plan performed by medical provider. Documentation and orders reviewed and attested to.  Inda Coke, PA-C [place]

## 2017-10-17 ENCOUNTER — Encounter: Payer: Self-pay | Admitting: Physician Assistant

## 2017-10-17 ENCOUNTER — Other Ambulatory Visit (INDEPENDENT_AMBULATORY_CARE_PROVIDER_SITE_OTHER): Payer: Self-pay

## 2017-10-17 DIAGNOSIS — Z0001 Encounter for general adult medical examination with abnormal findings: Secondary | ICD-10-CM

## 2017-10-17 DIAGNOSIS — E7800 Pure hypercholesterolemia, unspecified: Secondary | ICD-10-CM | POA: Insufficient documentation

## 2017-10-17 LAB — COMPREHENSIVE METABOLIC PANEL
ALBUMIN: 4.3 g/dL (ref 3.5–5.2)
ALT: 9 U/L (ref 0–35)
AST: 13 U/L (ref 0–37)
Alkaline Phosphatase: 53 U/L (ref 39–117)
BUN: 14 mg/dL (ref 6–23)
CO2: 29 mEq/L (ref 19–32)
Calcium: 9.3 mg/dL (ref 8.4–10.5)
Chloride: 101 mEq/L (ref 96–112)
Creatinine, Ser: 0.77 mg/dL (ref 0.40–1.20)
GFR: 82.81 mL/min (ref >60.00)
Glucose, Bld: 86 mg/dL (ref 70–99)
POTASSIUM: 4 meq/L (ref 3.5–5.1)
SODIUM: 138 meq/L (ref 135–145)
TOTAL PROTEIN: 7.1 g/dL (ref 6.0–8.3)
Total Bilirubin: 0.8 mg/dL (ref 0.2–1.2)

## 2017-10-17 LAB — CYTOLOGY - PAP: DIAGNOSIS: NEGATIVE

## 2017-10-17 LAB — CBC WITH DIFFERENTIAL/PLATELET
BASOS ABS: 0.1 10*3/uL (ref 0.0–0.1)
Basophils Relative: 1.2 % (ref 0.0–3.0)
EOS PCT: 1.9 % (ref 0.0–5.0)
Eosinophils Absolute: 0.1 10*3/uL (ref 0.0–0.7)
HCT: 38.3 % (ref 36.0–46.0)
HEMOGLOBIN: 12.9 g/dL (ref 12.0–15.0)
Lymphocytes Relative: 24.7 % (ref 12.0–46.0)
Lymphs Abs: 1.5 10*3/uL (ref 0.7–4.0)
MCHC: 33.5 g/dL (ref 30.0–36.0)
MCV: 94 fl (ref 78.0–100.0)
MONO ABS: 0.6 10*3/uL (ref 0.1–1.0)
Monocytes Relative: 10.2 % (ref 3.0–12.0)
Neutro Abs: 3.7 10*3/uL (ref 1.4–7.7)
Neutrophils Relative %: 62 % (ref 43.0–77.0)
Platelets: 223 10*3/uL (ref 150.0–400.0)
RBC: 4.07 Mil/uL (ref 3.87–5.11)
RDW: 12.9 % (ref 11.5–15.5)
WBC: 6 10*3/uL (ref 4.0–10.5)

## 2017-10-17 LAB — LIPID PANEL
CHOLESTEROL: 220 mg/dL — AB (ref 0–200)
HDL: 76.8 mg/dL (ref >39.00)
LDL CALC: 133 mg/dL — AB (ref 0–99)
NonHDL: 142.97
TRIGLYCERIDES: 52 mg/dL (ref 0.0–149.0)
Total CHOL/HDL Ratio: 3
VLDL: 10.4 mg/dL (ref 0.0–40.0)

## 2017-10-17 LAB — TSH: TSH: 2.65 u[IU]/mL (ref 0.35–4.50)

## 2017-10-19 ENCOUNTER — Ambulatory Visit: Payer: Self-pay | Admitting: Family Medicine

## 2017-10-25 ENCOUNTER — Encounter: Payer: Self-pay | Admitting: Sports Medicine

## 2017-10-25 ENCOUNTER — Ambulatory Visit: Payer: Self-pay

## 2017-10-25 ENCOUNTER — Ambulatory Visit: Payer: Self-pay | Admitting: Sports Medicine

## 2017-10-25 VITALS — BP 102/74 | HR 88 | Ht 67.0 in | Wt 145.6 lb

## 2017-10-25 DIAGNOSIS — M79641 Pain in right hand: Secondary | ICD-10-CM

## 2017-10-25 MED ORDER — DICLOFENAC SODIUM 2 % TD SOLN: 1.0000 "application " | Freq: Two times a day (BID) | TRANSDERMAL | 0 refills | Status: AC

## 2017-10-25 NOTE — Progress Notes (Signed)
Juanda Bond. Trampus [place], [place] at [place] - 57 y.o. female MRN [id number]  Date of birth: 07/26/1961  Visit Date: 10/25/2017  PCP: Inda Coke, PA   Referred by: Inda Coke, [place]   Scribe for today's visit: Josepha Pigg, CMA     SUBJECTIVE:  [name] "[name]" is here for No chief complaint on file.  Her RT hand pain symptoms INITIALLY: Began 08/2017 after falling. Sx flared up again after playing a lot of tennis over 1 week.  Described as moderate shooting, radiating to R arm.  Worsened with rotation. Improved with rest Additional associated symptoms include: Pain is along the medical aspect of the hand. She denies wrist pain. She has that they pain is right where her racket hits her hand. The pain is not daily. The area is not tender to palpation. Pain typically only occurs when there is something in her hand.     At this time symptoms show no change compared to onset, only occurs when pressure is being placed on the area or with certain movements.  She has been wearing wrist brace. She has taken a break from playing tennis.    ROS Denies night time disturbances. Denies fevers, chills, or night sweats. Denies unexplained weight loss. Reports personal history of cancer, breast cancer L. Denies changes in bowel or bladder habits. Denies recent unreported falls. Denies new or worsening dyspnea or wheezing. Denies headaches or dizziness.  Denies numbness, tingling or weakness  In the extremities.  Denies dizziness or presyncopal episodes Denies lower extremity edema     HISTORY & PERTINENT PRIOR DATA:  Prior History reviewed and updated per electronic medical record.  Significant history, findings, studies and interim changes include:  reports that  has never smoked. she has never used smokeless tobacco. No results for input(s): HGBA1C, LABURIC, CREATINE in the last 8760 hours. No  specialty comments available. Problem  Right Hand Pain    OBJECTIVE:  VS:  HT:5\' 7"  (170.2 cm)   WT:145 lb 9.6 oz (66 kg)  BMI:22.8    BP:102/74  HR:88bpm  TEMP: ( )  RESP:97 %   PHYSICAL EXAM: Constitutional: WDWN, Non-toxic appearing. Psychiatric: Alert & appropriately interactive.  Not depressed or anxious appearing. Respiratory: No increased work of breathing.  Trachea Midline Eyes: Pupils are equal.  EOM intact without nystagmus.  No scleral icterus  NEUROVASCULAR exam: No clubbing or cyanosis appreciated No significant venous stasis changes Capillary Refill: normal, less than 2 seconds   Right hand is overall well aligned without significant deformity.  She has focal pain directly over the hook of the hamate and small amount of pain with grip testing but symptoms are mild at this time.  Grip strength is normal however once again pain is reproduced with gripping especially with something over the hamate otherwise wrist range of motion is normal.  No wrist effusion.  No pain over the DRUJ.  No pain within the scaphoid    ASSESSMENT & PLAN:   1. Right hand pain    PLAN:    Right hand pain Symptoms are directly over the hamate and I am concerned for potential hamate fracture.  There is a small amount of fluid along the dorsal aspect of the hamate as well as the palmar aspect but no obvious fracture is appreciated on x-rays or MSK ultrasound today.  Given her activity level further diagnostic evaluation with CT scan to rule out occult  fracture indicated.  As long as this is reassuring we will have her return to activities as tolerated and have her use topical anti-inflammatories twice daily for the next 2 weeks.  Samples of Pennsaid provided today.  Follow-up will be based on CT scan findings and we will plan to call her with these results.   ++++++++++++++++++++++++++++++++++++++++++++ Orders & Meds: Orders Placed This Encounter  Procedures  . [place] MSK POCT ULTRASOUND  . CT  HAND RIGHT WO CONTRAST    Meds ordered this encounter  Medications  . Diclofenac Sodium (PENNSAID) 2 % SOLN    Sig: Place 1 application onto the skin 2 (two) times daily for 1 day.    Dispense:  8 g    Refill:  0    ++++++++++++++++++++++++++++++++++++++++++++ Follow-up: Return for after CT scan but will call w/ results.   Pertinent documentation may be included in additional procedure notes, imaging studies, problem based documentation and patient instructions. Please see these sections of the encounter for additional information regarding this visit. CMA/ATC served as Education administrator during this visit. History, Physical, and Plan performed by medical provider. Documentation and orders reviewed and attested to.      Gerda Diss, [facility] Physician

## 2017-10-25 NOTE — Procedures (Signed)
LIMITED MSK ULTRASOUND OF right hand Images were obtained and interpreted by myself, Teresa Coombs, DO  Images have been saved and stored to PACS system. Images obtained on: GE S7 Ultrasound machine  FINDINGS:   There is a small amount of increased hypoechoic change directly over the medial portion of the hamate from the dorsal view, palmar views are limited due to tissue penetration difficulties however it does appear to be a possible increased area of hypoechoic change over the hamate.  There is increased neovascularity diffusely within the palm.  No significant soft tissue disruption  IMPRESSION:  1. Inconclusive MSK ultrasound of the right hand, cannot rule out hook of hamate fracture

## 2017-10-25 NOTE — Assessment & Plan Note (Signed)
Symptoms are directly over the hamate and I am concerned for potential hamate fracture.  There is a small amount of fluid along the dorsal aspect of the hamate as well as the palmar aspect but no obvious fracture is appreciated on x-rays or MSK ultrasound today.  Given her activity level further diagnostic evaluation with CT scan to rule out occult fracture indicated.  As long as this is reassuring we will have her return to activities as tolerated and have her use topical anti-inflammatories twice daily for the next 2 weeks.  Samples of Pennsaid provided today.  Follow-up will be based on CT scan findings and we will plan to call her with these results.

## 2017-11-01 ENCOUNTER — Ambulatory Visit
Admission: RE | Admit: 2017-11-01 | Discharge: 2017-11-01 | Disposition: A | Discharge status: Home / Self Care | Payer: No Typology Code available for payment source | Source: Ambulatory Visit | Attending: Sports Medicine | Admitting: Sports Medicine

## 2017-11-01 DIAGNOSIS — M79641 Pain in right hand: Secondary | ICD-10-CM

## 2017-11-06 ENCOUNTER — Ambulatory Visit: Payer: Self-pay | Admitting: Sports Medicine

## 2017-11-06 ENCOUNTER — Encounter: Payer: Self-pay | Admitting: Sports Medicine

## 2017-11-06 DIAGNOSIS — S60221D Contusion of right hand, subsequent encounter: Secondary | ICD-10-CM

## 2017-11-06 DIAGNOSIS — M79641 Pain in right hand: Secondary | ICD-10-CM

## 2017-11-06 NOTE — Progress Notes (Signed)
Alyssa Riley. [name], [place] at [place] - 57 y.o. female MRN [id number]  Date of birth: 1961-03-21  Visit Date: 11/06/2017  PCP: Inda Coke, PA   Referred by: Inda Coke, [place]   Scribe for today's visit: Josepha Pigg, CMA     SUBJECTIVE:  Alyssa Riley "[name]" is here for No chief complaint on file.  10/25/17: Her RT hand pain symptoms INITIALLY: Began 08/2017 after falling. Sx flared up again after playing a lot of tennis over 1 week.  Described as moderate shooting, radiating to R arm.  Worsened with rotation. Improved with rest Additional associated symptoms include: Pain is along the medical aspect of the hand. She denies wrist pain. She has that they pain is right where her racket hits her hand. The pain is not daily. The area is not tender to palpation. Pain typically only occurs when there is something in her hand.  At this time symptoms show no change compared to onset, only occurs when pressure is being placed on the area or with certain movements.  She has been wearing wrist brace. She has taken a break from playing tennis.   11/06/17: Compared to the last office visit, her previously described symptoms show no change  Current symptoms are mild & are nonradiating She continues to wear wrist brace daily. She does take it off periodically throughout the day because it causes pain in the rest of her arm.    ROS Denies night time disturbances. Denies fevers, chills, or night sweats. Denies unexplained weight loss. Reports personal history of cancer, L breast. Denies changes in bowel or bladder habits. Denies recent unreported falls. Denies new or worsening dyspnea or wheezing. Denies headaches or dizziness.  Denies numbness, tingling or weakness  In the extremities.  Denies dizziness or presyncopal episodes Denies lower extremity edema    HISTORY & PERTINENT PRIOR DATA:  Prior  History reviewed and updated per electronic medical record.  Significant history, findings, studies and interim changes include:  reports that  has never smoked. she has never used smokeless tobacco. No results for input(s): HGBA1C, LABURIC, CREATINE in the last 8760 hours. No specialty comments available. No problems updated.  OBJECTIVE:  VS:  HT:    WT:   BMI:     BP:   HR: bpm  TEMP: ( )  RESP:    PHYSICAL EXAM: Constitutional: WDWN, Non-toxic appearing. Psychiatric: Alert & appropriately interactive.  Not depressed or anxious appearing. Respiratory: No increased work of breathing.  Trachea Midline Eyes: Pupils are equal.  EOM intact without nystagmus.  No scleral icterus  NEUROVASCULAR exam: No clubbing or cyanosis appreciated No significant venous stasis changes Capillary Refill: normal, less than 2 seconds   Right hand is overall well aligned without significant deformity.  She has mild pain with palpation of the lateral carpal row no pain over the TFCC or DRUJ.  Grip strength is intact.  Pain is worse with gripping and wrist extension cannot be reproduced on a regular basis.  No significant numbness or tingling.  ASSESSMENT & PLAN:   1. Right hand pain   2. Contusion of right hand, subsequent encounter    ++++++++++++++++++++++++++++++++++++++++++++ Orders & Meds: No orders of the defined types were placed in this encounter.  No orders of the defined types were placed in this encounter.   ++++++++++++++++++++++++++++++++++++++++++++ PLAN:   Findings:  Symptoms are occurring only intermittently and are very short-lived.  This  does seem to be more reflective of an osseous contusion of the lateral carpal bones.  Likely there is no evidence of significant injury to the hook of the hamate and further mobilization is not indicated.  Compression for the hand can be beneficial in continuing with anti-inflammatories recommended.  Avoidance of any type of significant gripping  activities for the next 2 weeks then okay to return to tennis as tolerated.  Cool water soaking is also recommended and can be used as needed.  We did discuss that this will likely take quite a while for this to completely resolve especially if it is continuously irritated with activities such as tennis but I am optimistic that with an additional 2 weeks of rest that she should be doing well.  If any persistent symptoms we will plan to repeat MSK ultrasound to evaluate for the extent of continued soft tissue swelling which at this time is mild but can vaguely be appreciated on CT scan.   No problem-specific Assessment & Plan notes found for this encounter.   Follow-up: Return in about 6 weeks (around 12/18/2017).   Pertinent documentation may be included in additional procedure notes, imaging studies, problem based documentation and patient instructions. Please see these sections of the encounter for additional information regarding this visit. CMA/ATC served as Education administrator during this visit. History, Physical, and Plan performed by medical provider. Documentation and orders reviewed and attested to.      Gerda Diss, [facility] Physician

## 2017-11-06 NOTE — Patient Instructions (Signed)
I recommend you obtained a compression sleeve to help with your joint problems. There are many options on the market however I recommend obtaining a wrist Body Helix compression sleeve.  You can find information (including how to appropriate measure yourself for sizing) can be found at www.Body [email or web address].com.  Many of these products are health savings account (HSA) eligible.   You can use the compression sleeve at any time throughout the day but is most important to use while being active as well as for 2 hours post-activity.   It is appropriate to ice following activity with the compression sleeve in place.   

## 2017-12-19 ENCOUNTER — Ambulatory Visit: Payer: Self-pay | Admitting: Sports Medicine

## 2018-01-01 ENCOUNTER — Ambulatory Visit: Payer: Self-pay | Admitting: Sports Medicine

## 2018-05-15 IMAGING — MG 2D DIGITAL DIAGNOSTIC BILATERAL MAMMOGRAM WITH CAD AND ADJUNCT T
9 of 13 series · 9 of 29 positions shown · non-contrast
Comparison: Previous exam(s).

CLINICAL DATA: Left lumpectomy.  Annual mammography.

EXAM:
2D DIGITAL DIAGNOSTIC BILATERAL MAMMOGRAM WITH CAD AND ADJUNCT TOMO

[L CC (1 of 2)]
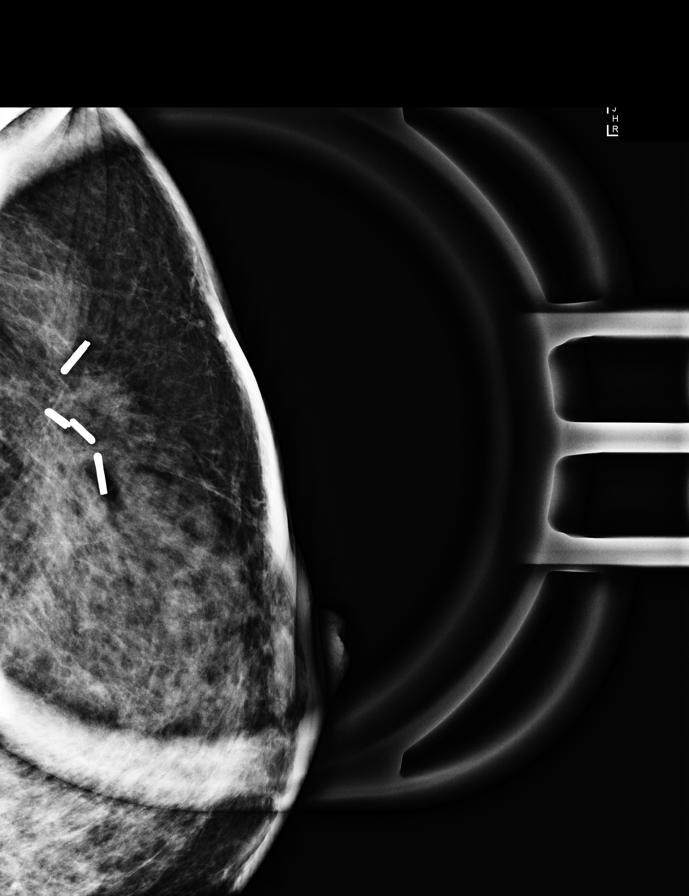

[L CC (2 of 2)]
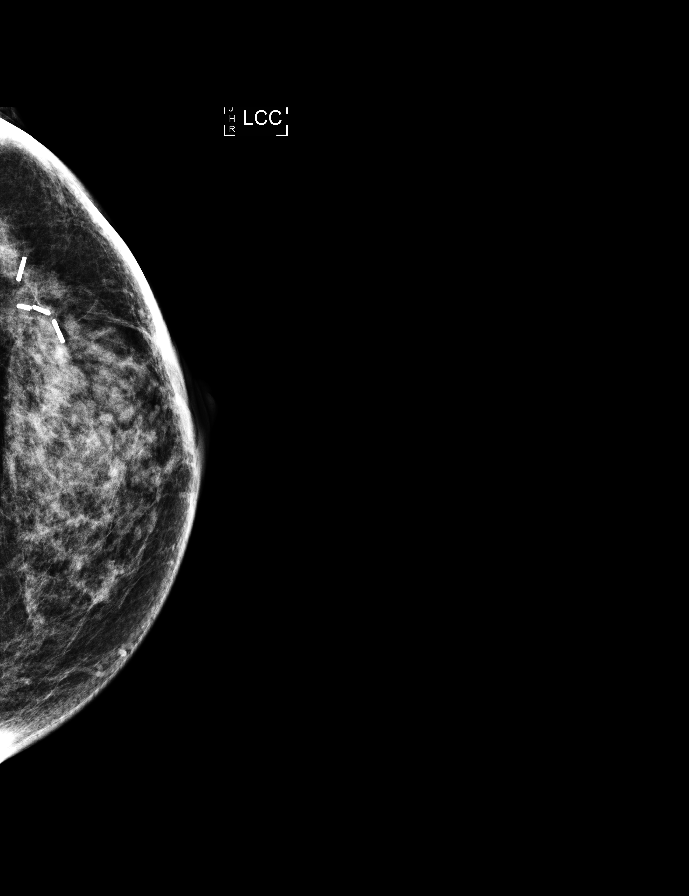

[R MLO synth-2D]
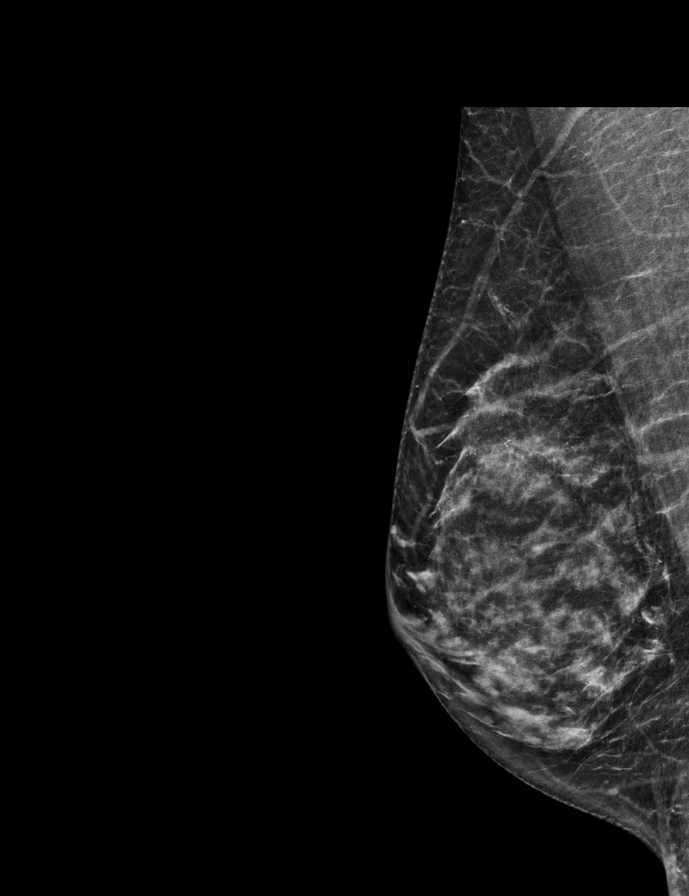

[L MLO]
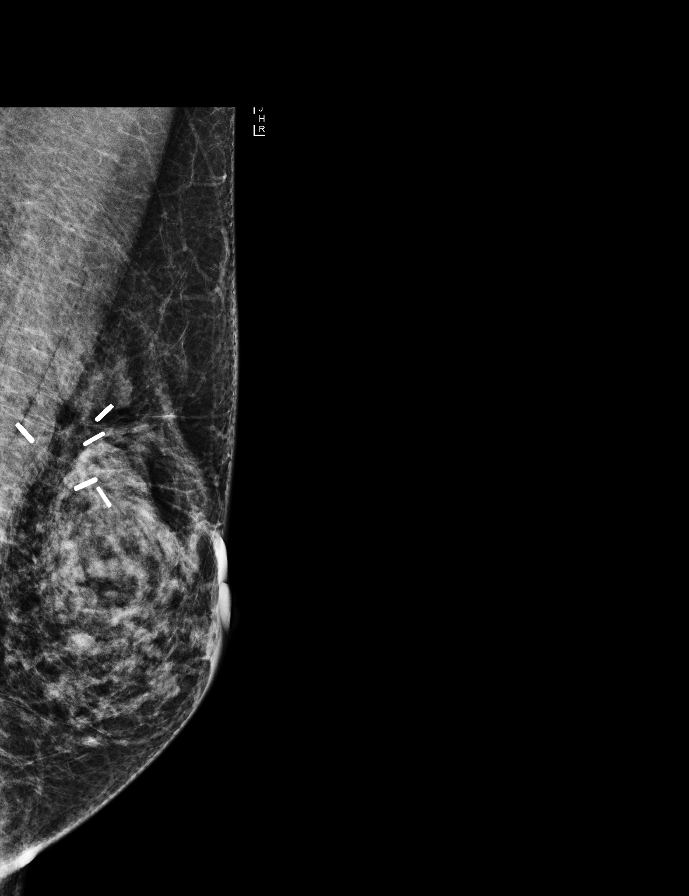

[R CC]
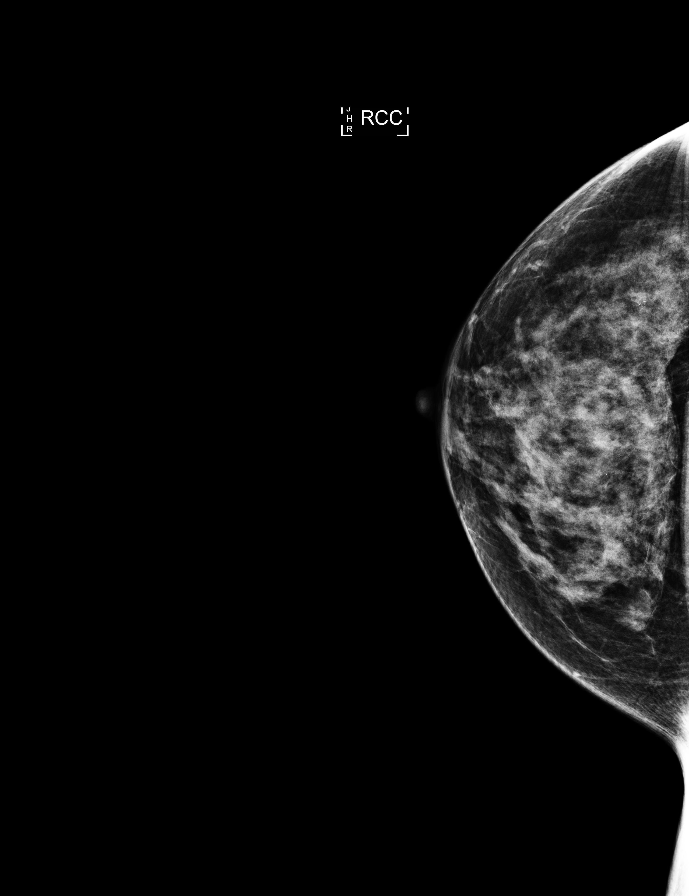

[L MLO synth-2D]
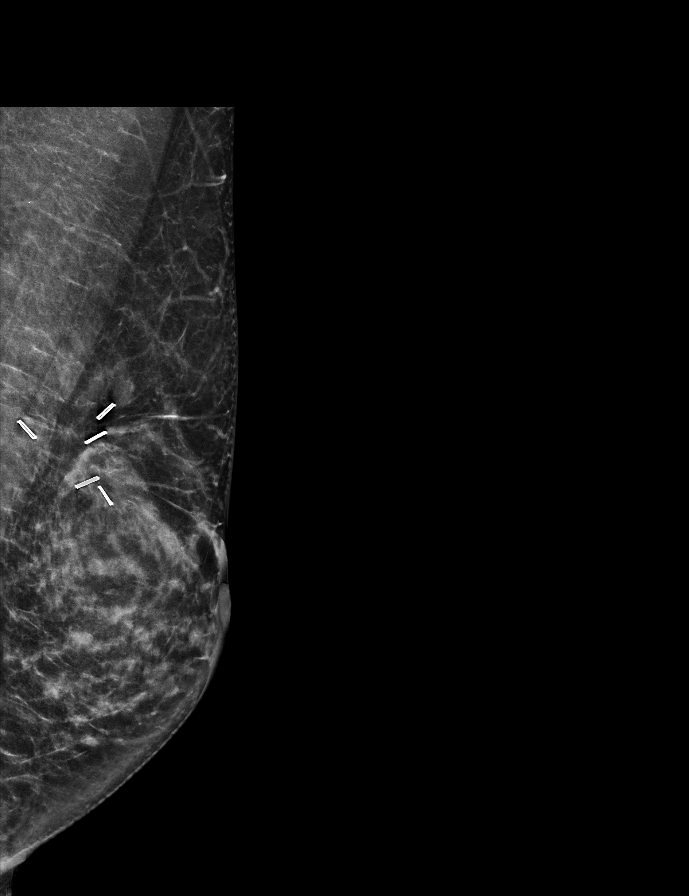

[R CC synth-2D]
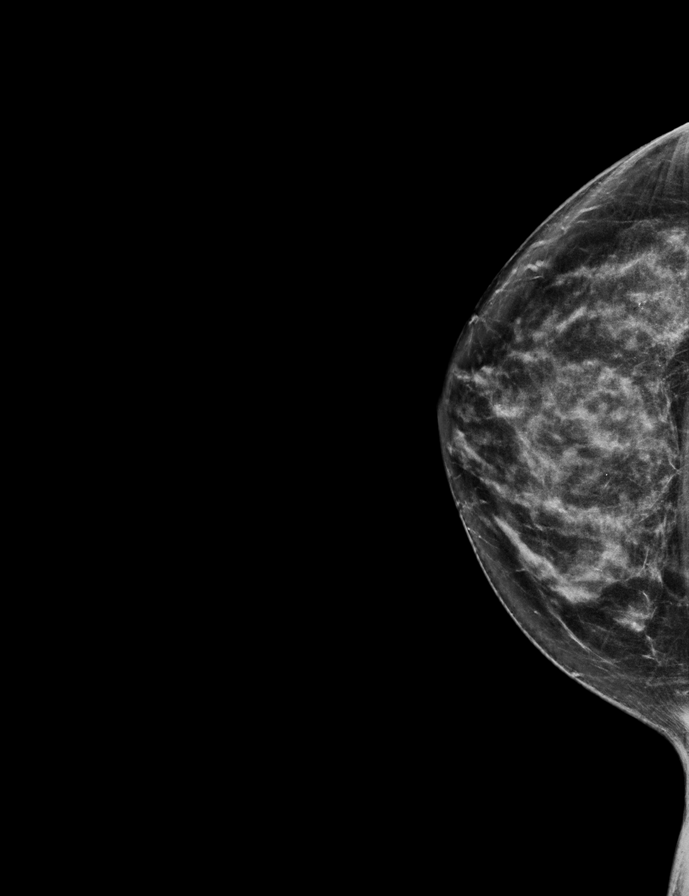

[L CC synth-2D]
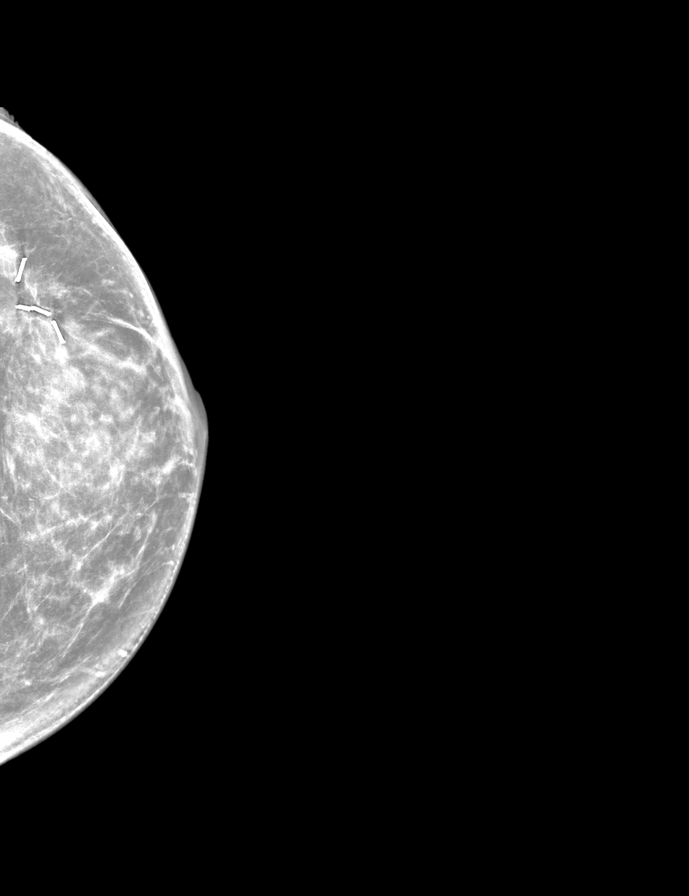

[R MLO]
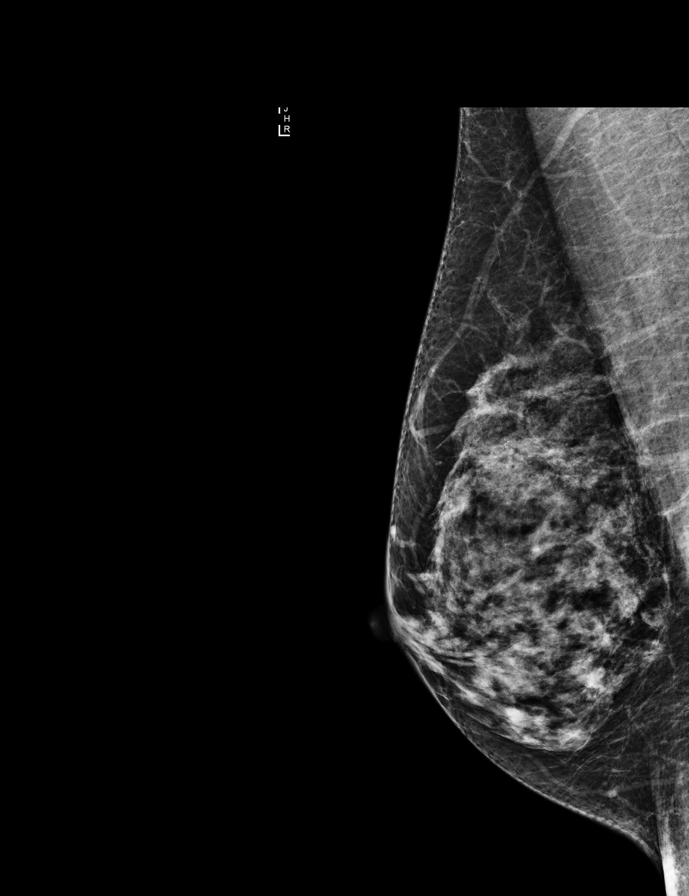

[9 of 29 positions shown; findings below may reference images not displayed]

ACR Breast Density Category c: The breast tissue is heterogeneously
dense, which may obscure small masses.
FINDINGS: The left lumpectomy site appears as expected. No suspicious masses,
calcifications, or distortion.

Mammographic images were processed with CAD.
IMPRESSION: No mammographic evidence of malignancy.

RECOMMENDATION:
Annual diagnostic mammography.

I have discussed the findings and recommendations with the patient.
Results were also provided in writing at the conclusion of the
visit. If applicable, a reminder letter will be sent to the patient
regarding the next appointment.

BI-RADS CATEGORY  2: Benign.

## 2018-08-06 ENCOUNTER — Other Ambulatory Visit: Payer: Self-pay | Admitting: Physician Assistant

## 2018-08-06 DIAGNOSIS — Z853 Personal history of malignant neoplasm of breast: Secondary | ICD-10-CM

## 2018-09-06 ENCOUNTER — Other Ambulatory Visit: Payer: Self-pay

## 2018-09-06 ENCOUNTER — Other Ambulatory Visit: Payer: Self-pay | Admitting: Physician Assistant

## 2018-09-06 DIAGNOSIS — Z853 Personal history of malignant neoplasm of breast: Secondary | ICD-10-CM

## 2018-09-10 ENCOUNTER — Other Ambulatory Visit: Payer: Self-pay | Admitting: Physician Assistant

## 2018-09-10 ENCOUNTER — Ambulatory Visit
Admission: RE | Admit: 2018-09-10 | Discharge: 2018-09-10 | Disposition: A | Discharge status: Home / Self Care | Payer: Self-pay | Source: Ambulatory Visit | Attending: Physician Assistant | Admitting: Physician Assistant

## 2018-09-10 DIAGNOSIS — Z853 Personal history of malignant neoplasm of breast: Secondary | ICD-10-CM

## 2018-10-10 ENCOUNTER — Telehealth: Payer: Self-pay | Admitting: Physician Assistant

## 2018-10-10 NOTE — Telephone Encounter (Signed)
Please advise if pt can do labs prior.  Copied from [place] [phone]. Topic: General - Other >> Oct 08, 2018  4:03 PM Leward Quan A wrote: Reason for CRM: Patient would like to have labs drawn before her appointment for her physical on 11/05/2018. Please advise request a call back with an answer please Ph# [phone]

## 2018-10-11 ENCOUNTER — Other Ambulatory Visit: Payer: Self-pay | Admitting: Physician Assistant

## 2018-10-11 DIAGNOSIS — Z0000 Encounter for general adult medical examination without abnormal findings: Secondary | ICD-10-CM

## 2018-10-11 DIAGNOSIS — E785 Hyperlipidemia, unspecified: Secondary | ICD-10-CM

## 2018-10-11 NOTE — Telephone Encounter (Signed)
Please see message. °

## 2018-10-11 NOTE — Telephone Encounter (Signed)
Orders placed. Please make lab appt for patient 3-5 days prior to physical.

## 2018-10-11 NOTE — Telephone Encounter (Signed)
Please call pt and schedule lab appointment 3-5 days prior to physical per [facility]. Orders are in [place].

## 2018-10-30 ENCOUNTER — Other Ambulatory Visit (INDEPENDENT_AMBULATORY_CARE_PROVIDER_SITE_OTHER): Payer: Self-pay

## 2018-10-30 DIAGNOSIS — Z0000 Encounter for general adult medical examination without abnormal findings: Secondary | ICD-10-CM

## 2018-10-30 DIAGNOSIS — E785 Hyperlipidemia, unspecified: Secondary | ICD-10-CM

## 2018-10-30 LAB — COMPREHENSIVE METABOLIC PANEL
ALT: 12 U/L (ref 0–35)
AST: 15 U/L (ref 0–37)
Albumin: 4.1 g/dL (ref 3.5–5.2)
Alkaline Phosphatase: 46 U/L (ref 39–117)
BUN: 21 mg/dL (ref 6–23)
CHLORIDE: 105 meq/L (ref 96–112)
CO2: 26 meq/L (ref 19–32)
CREATININE: 0.73 mg/dL (ref 0.40–1.20)
Calcium: 9.1 mg/dL (ref 8.4–10.5)
GFR: 82.54 mL/min (ref >60.00)
GLUCOSE: 79 mg/dL (ref 70–99)
Potassium: 4.4 mEq/L (ref 3.5–5.1)
Sodium: 140 mEq/L (ref 135–145)
Total Bilirubin: 0.6 mg/dL (ref 0.2–1.2)
Total Protein: 6.8 g/dL (ref 6.0–8.3)

## 2018-10-30 LAB — CBC WITH DIFFERENTIAL/PLATELET
BASOS ABS: 0.1 10*3/uL (ref 0.0–0.1)
Basophils Relative: 1 % (ref 0.0–3.0)
EOS PCT: 1.8 % (ref 0.0–5.0)
Eosinophils Absolute: 0.1 10*3/uL (ref 0.0–0.7)
HCT: 36.3 % (ref 36.0–46.0)
HEMOGLOBIN: 12.3 g/dL (ref 12.0–15.0)
LYMPHS ABS: 1.7 10*3/uL (ref 0.7–4.0)
Lymphocytes Relative: 28.9 % (ref 12.0–46.0)
MCHC: 33.8 g/dL (ref 30.0–36.0)
MCV: 93.8 fl (ref 78.0–100.0)
Monocytes Absolute: 0.6 10*3/uL (ref 0.1–1.0)
Monocytes Relative: 9.8 % (ref 3.0–12.0)
NEUTROS PCT: 58.5 % (ref 43.0–77.0)
Neutro Abs: 3.5 10*3/uL (ref 1.4–7.7)
Platelets: 220 10*3/uL (ref 150.0–400.0)
RBC: 3.87 Mil/uL (ref 3.87–5.11)
RDW: 13.4 % (ref 11.5–15.5)
WBC: 6 10*3/uL (ref 4.0–10.5)

## 2018-10-30 LAB — LIPID PANEL
Cholesterol: 201 mg/dL — ABNORMAL HIGH (ref 0–200)
HDL: 87.9 mg/dL (ref >39.00)
LDL CALC: 102 mg/dL — AB (ref 0–99)
NONHDL: 113.15
Total CHOL/HDL Ratio: 2
Triglycerides: 55 mg/dL (ref 0.0–149.0)
VLDL: 11 mg/dL (ref 0.0–40.0)

## 2018-11-05 ENCOUNTER — Ambulatory Visit: Payer: Self-pay | Admitting: Physician Assistant

## 2018-11-05 ENCOUNTER — Encounter: Payer: Self-pay | Admitting: Physician Assistant

## 2018-11-05 VITALS — BP 102/70 | HR 85 | Temp 98.2°F | Ht 66.25 in | Wt 124.4 lb

## 2018-11-05 DIAGNOSIS — Z1211 Encounter for screening for malignant neoplasm of colon: Secondary | ICD-10-CM

## 2018-11-05 DIAGNOSIS — D6851 Activated protein C resistance: Secondary | ICD-10-CM

## 2018-11-05 DIAGNOSIS — Z86718 Personal history of other venous thrombosis and embolism: Secondary | ICD-10-CM

## 2018-11-05 DIAGNOSIS — Z171 Estrogen receptor negative status [ER-]: Secondary | ICD-10-CM

## 2018-11-05 DIAGNOSIS — C50412 Malignant neoplasm of upper-outer quadrant of left female breast: Secondary | ICD-10-CM

## 2018-11-05 DIAGNOSIS — Z0000 Encounter for general adult medical examination without abnormal findings: Secondary | ICD-10-CM

## 2018-11-05 NOTE — Progress Notes (Signed)
Subjective:    Alyssa Riley is a 58 y.o. female and is here for a comprehensive physical exam.   HPI  Health Maintenance Due  Topic Date Due  . Hepatitis C Screening  01-20-1961  . HIV Screening  01/28/1978  . TETANUS/TDAP  01/28/1982  . COLONOSCOPY  01/28/2013   Acute Concerns: None  Chronic Issues: Breast cancer -- Radiation completed 12/27/16. Mammogram performed in January and was normal. Factor 5 Leiden and hx of DVT -- Hx of DVT after flight over 20 years ago. Per our chart documentation last year, "She states that she had "an agreement" with her hematologist that she will take [place] when flying. She understands the risk of not taking it everyday." Takes xarelto prn for flight travel.  The 10-year ASCVD risk score Mikey Bussing DC Brooke Bonito., et al., 2013) is: 0.8%   Values used to calculate the score:     Age: 73 years     Sex: Female     Is Non-Hispanic African American: No     Diabetic: No     Tobacco smoker: No     Systolic Blood Pressure: 563 mmHg     Is BP treated: No     HDL Cholesterol: 87.9 mg/dL     Total Cholesterol: 201 mg/dL  Health Maintenance: Immunizations -- declines flu and tdap Colonoscopy -- has had cologuard in past however we do not have record of this Mammogram -- 09/10/2018  PAP -- 10/16/2017 -- NILM Diet -- overall well balanced Sleep habits -- occasional melatonin, esp on Sunday nights Exercise -- plays tennis, very active Weight -- Weight: 124 lb 6.1 oz (56.4 kg) -- has had intentional weight loss Mood -- no issues Weight history: Wt Readings from Last 10 Encounters:  11/05/18 124 lb 6.1 oz (56.4 kg)  10/25/17 145 lb 9.6 oz (66 kg)  10/16/17 145 lb 9.6 oz (66 kg)  08/10/17 147 lb 9.6 oz (67 kg)  03/21/17 144 lb 9.6 oz (65.6 kg)  02/19/17 145 lb (65.8 kg)  01/05/17 143 lb 12.8 oz (65.2 kg)  01/04/17 143 lb 8 oz (65.1 kg)  12/07/16 145 lb 9.6 oz (66 kg)  10/30/16 144 lb 6.4 oz (65.5 kg)   No LMP recorded. Patient is  postmenopausal.   Body mass index is 19.92 kg/m.   Depression screen PHQ 2/9 03/21/2017  Decreased Interest 0  Down, Depressed, Hopeless 0  PHQ - 2 Score 0     Other providers/specialists: Patient Care Team: Inda Coke, [place] as PCP - General (Physician Assistant) Nicholas Lose, MD as Consulting Physician (Hematology and Oncology) Kyung Rudd, MD as Consulting Physician (Radiation Oncology) Donnie Mesa, MD as Consulting Physician (General Surgery) Delice Bison, Charlestine Massed, NP as Nurse Practitioner (Hematology and Oncology)    PMHx, SurgHx, SocialHx, Medications, and Allergies were reviewed in the Visit Navigator and updated as appropriate.   Past Medical History:  Diagnosis Date  . Breast cancer ([place]) 09/13/2016   left breast   . DVT (deep venous thrombosis) ([place]) 06/2016   Left calf  . Factor 5 Leiden mutation, heterozygous ([place])   . Hypercholesteremia   . Personal history of radiation therapy 2018     Past Surgical History:  Procedure Laterality Date  . BREAST LUMPECTOMY Left 10/2016  . BREAST LUMPECTOMY WITH RADIOACTIVE SEED LOCALIZATION Left 10/10/2016   Procedure: LEFT BREAST LUMPECTOMY WITH RADIOACTIVE SEED LOCALIZATION;  Surgeon: Donnie Mesa, MD;  Location: [facility];  Service: General;  Laterality: Left;  . DILATION  AND CURETTAGE OF UTERUS  1998  . FRACTURE SURGERY Left    foot at age 42    History reviewed. No pertinent family history.  Social History   Tobacco Use  . Smoking status: Never Smoker  . Smokeless tobacco: Never Used  Substance Use Topics  . Alcohol use: Yes    Comment: social   . Drug use: No    Review of Systems:   Review of Systems  Constitutional: Negative for chills, fever, malaise/fatigue and weight loss.  HENT: Negative for hearing loss, sinus pain and sore throat.   Respiratory: Negative for cough, hemoptysis and shortness of breath.   Cardiovascular: Negative for chest pain, palpitations, orthopnea,  claudication, leg swelling and PND.  Gastrointestinal: Negative for abdominal pain, constipation, diarrhea, heartburn, nausea and vomiting.  Genitourinary: Negative for dysuria, frequency and urgency.  Musculoskeletal: Negative for back pain, myalgias and neck pain.  Skin: Negative for itching and rash.  Neurological: Negative for dizziness, tingling, seizures and headaches.  Endo/Heme/Allergies: Negative for polydipsia.  Psychiatric/Behavioral: Negative for depression. The patient is not nervous/anxious.     Objective:   BP 102/70 (BP Location: Left Arm, Patient Position: Sitting, Cuff Size: Normal)   Pulse 85   Temp 98.2 F (36.8 C) (Oral)   Ht 5' 6.25" (1.683 m)   Wt 124 lb 6.1 oz (56.4 kg)   SpO2 97%   BMI 19.92 kg/m  Body mass index is 19.92 kg/m.   General Appearance:    Alert, cooperative, no distress, appears stated age  Head:    Normocephalic, without obvious abnormality, atraumatic  Eyes:    PERRL, conjunctiva/corneas clear, EOM's intact, fundi    benign, both eyes  Ears:    Normal TM's and external ear canals, both ears  Nose:   Nares normal, septum midline, mucosa normal, no drainage    or sinus tenderness  Throat:   Lips, mucosa, and tongue normal; teeth and gums normal  Neck:   Supple, symmetrical, trachea midline, no adenopathy;    thyroid:  no enlargement/tenderness/nodules; no carotid   bruit or JVD  Back:     Symmetric, no curvature, ROM normal, no CVA tenderness  Lungs:     Clear to auscultation bilaterally, respirations unlabored      Heart:    Regular rate and rhythm, S1 and S2 normal, no murmur, rub or gallop  Breast Exam:    Deferred  Abdomen:     Soft, non-tender, bowel sounds active all four quadrants,    no masses, no organomegaly  Genitalia:    Deferred  Extremities:   Extremities normal, atraumatic, no cyanosis or edema  Pulses:   2+ and symmetric all extremities  Skin:   Skin color, texture, turgor normal, no rashes or lesions  Lymph nodes:    Cervical, supraclavicular, and axillary nodes normal  Neurologic:   CNII-XII intact, normal strength, sensation and reflexes    throughout    Assessment/Plan:   Alyssa Riley was seen today for annual exam.  Diagnoses and all orders for this visit:  Routine physical examination Today patient counseled on age appropriate routine health concerns for screening and prevention, each reviewed and up to date or declined. Immunizations reviewed and up to date or declined. Labs ordered and reviewed. Risk factors for depression reviewed and negative. Hearing function and visual acuity are intact. ADLs screened and addressed as needed. Functional ability and level of safety reviewed and appropriate. Education, counseling and referrals performed based on assessed risks today. Patient provided with a  copy of personalized plan for preventive services.  History of DVT (deep vein thrombosis); Factor V Leiden ([place]) Takes xarelto 15 mg prn when flying. She is out of samples -- we plan to contact her if we get more, as she is self pay.  Screening for colon cancer Agreeable to cologuard.  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   Well Adult Exam: Labs ordered: Yes. Patient counseling was done. See below for items discussed. Discussed the patient's BMI.  The BMI is in the acceptable range Follow up as needed for acute illness. Breast cancer screening: UTD. Cervical cancer screening: UTD   Patient Counseling: [x]    Nutrition: Stressed importance of moderation in sodium/caffeine intake, saturated fat and cholesterol, caloric balance, sufficient intake of fresh fruits, vegetables, fiber, calcium, iron, and 1 mg of folate supplement per day (for females capable of pregnancy).  [x]    Stressed the importance of regular exercise.   []    Substance Abuse: Discussed cessation/primary prevention of tobacco, alcohol, or other drug use; driving or other dangerous activities under the influence; availability of  treatment for abuse.   [x]    Injury prevention: Discussed safety belts, safety helmets, smoke detector, smoking near bedding or upholstery.   [x]    Sexuality: Discussed sexually transmitted diseases, partner selection, use of condoms, avoidance of unintended pregnancy  and contraceptive alternatives.  [x]    Dental health: Discussed importance of regular tooth brushing, flossing, and dental visits.  [x]    Health maintenance and immunizations reviewed. Please refer to Health maintenance section.    Inda Coke, PA-C [place]

## 2018-11-05 NOTE — Patient Instructions (Signed)
It was great to see you!  Please go to the lab for blood work.   Our office will call you with your results unless you have chosen to receive results via MyChart.  If your blood work is normal we will follow-up each year for physicals and as scheduled for chronic medical problems.  If anything is abnormal we will treat accordingly and get you in for a follow-up.  Take care,  Aldona Bar  *We will contact you once we get more samples of Xarelto in!  *We will put in referral for cologuard as well.  Health Maintenance, Female Adopting a healthy lifestyle and getting preventive care can go a long way to promote health and wellness. Talk with your health care provider about what schedule of regular examinations is right for you. This is a good chance for you to check in with your provider about disease prevention and staying healthy. In between checkups, there are plenty of things you can do on your own. Experts have done a lot of research about which lifestyle changes and preventive measures are most likely to keep you healthy. Ask your health care provider for more information. Weight and diet Eat a healthy diet  Be sure to include plenty of vegetables, fruits, low-fat dairy products, and lean protein.  Do not eat a lot of foods high in solid fats, added sugars, or salt.  Get regular exercise. This is one of the most important things you can do for your health. ? Most adults should exercise for at least 150 minutes each week. The exercise should increase your heart rate and make you sweat (moderate-intensity exercise). ? Most adults should also do strengthening exercises at least twice a week. This is in addition to the moderate-intensity exercise. Maintain a healthy weight  Body mass index (BMI) is a measurement that can be used to identify possible weight problems. It estimates body fat based on height and weight. Your health care provider can help determine your BMI and help you achieve  or maintain a healthy weight.  For females 46 years of age and older: ? A BMI below 18.5 is considered underweight. ? A BMI of 18.5 to 24.9 is normal. ? A BMI of 25 to 29.9 is considered overweight. ? A BMI of 30 and above is considered obese. Watch levels of cholesterol and blood lipids  You should start having your blood tested for lipids and cholesterol at 56 years of age, then have this test every 5 years.  You may need to have your cholesterol levels checked more often if: ? Your lipid or cholesterol levels are high. ? You are older than 56 years of age. ? You are at high risk for heart disease. Cancer screening Lung Cancer  Lung cancer screening is recommended for adults 63-12 years old who are at high risk for lung cancer because of a history of smoking.  A yearly low-dose CT scan of the lungs is recommended for people who: ? Currently smoke. ? Have quit within the past 15 years. ? Have at least a 30-pack-year history of smoking. A pack year is smoking an average of one pack of cigarettes a day for 1 year.  Yearly screening should continue until it has been 15 years since you quit.  Yearly screening should stop if you develop a health problem that would prevent you from having lung cancer treatment. Breast Cancer  Practice breast self-awareness. This means understanding how your breasts normally appear and feel.  It also means  doing regular breast self-exams. Let your health care provider know about any changes, no matter how small.  If you are in your 20s or 30s, you should have a clinical breast exam (CBE) by a health care provider every 1-3 years as part of a regular health exam.  If you are 73 or older, have a CBE every year. Also consider having a breast X-ray (mammogram) every year.  If you have a family history of breast cancer, talk to your health care provider about genetic screening.  If you are at high risk for breast cancer, talk to your health care provider  about having an MRI and a mammogram every year.  Breast cancer gene (BRCA) assessment is recommended for women who have family members with BRCA-related cancers. BRCA-related cancers include: ? Breast. ? Ovarian. ? Tubal. ? Peritoneal cancers.  Results of the assessment will determine the need for genetic counseling and BRCA1 and BRCA2 testing. Cervical Cancer Your health care provider may recommend that you be screened regularly for cancer of the pelvic organs (ovaries, uterus, and vagina). This screening involves a pelvic examination, including checking for microscopic changes to the surface of your cervix (Pap test). You may be encouraged to have this screening done every 3 years, beginning at age 41.  For women ages 61-65, health care providers may recommend pelvic exams and Pap testing every 3 years, or they may recommend the Pap and pelvic exam, combined with testing for human papilloma virus (HPV), every 5 years. Some types of HPV increase your risk of cervical cancer. Testing for HPV may also be done on women of any age with unclear Pap test results.  Other health care providers may not recommend any screening for nonpregnant women who are considered low risk for pelvic cancer and who do not have symptoms. Ask your health care provider if a screening pelvic exam is right for you.  If you have had past treatment for cervical cancer or a condition that could lead to cancer, you need Pap tests and screening for cancer for at least 20 years after your treatment. If Pap tests have been discontinued, your risk factors (such as having a new sexual partner) need to be reassessed to determine if screening should resume. Some women have medical problems that increase the chance of getting cervical cancer. In these cases, your health care provider may recommend more frequent screening and Pap tests. Colorectal Cancer  This type of cancer can be detected and often prevented.  Routine colorectal  cancer screening usually begins at 56 years of age and continues through 56 years of age.  Your health care provider may recommend screening at an earlier age if you have risk factors for colon cancer.  Your health care provider may also recommend using home test kits to check for hidden blood in the stool.  A small camera at the end of a tube can be used to examine your colon directly (sigmoidoscopy or colonoscopy). This is done to check for the earliest forms of colorectal cancer.  Routine screening usually begins at age 58.  Direct examination of the colon should be repeated every 5-10 years through 56 years of age. However, you may need to be screened more often if early forms of precancerous polyps or small growths are found. Skin Cancer  Check your skin from head to toe regularly.  Tell your health care provider about any new moles or changes in moles, especially if there is a change in a mole's shape or  color.  Also tell your health care provider if you have a mole that is larger than the size of a pencil eraser.  Always use sunscreen. Apply sunscreen liberally and repeatedly throughout the day.  Protect yourself by wearing long sleeves, pants, a wide-brimmed hat, and sunglasses whenever you are outside. Heart disease, diabetes, and high blood pressure  High blood pressure causes heart disease and increases the risk of stroke. High blood pressure is more likely to develop in: ? People who have blood pressure in the high end of the normal range (130-139/85-89 mm Hg). ? People who are overweight or obese. ? People who are African American.  If you are 61-17 years of age, have your blood pressure checked every 3-5 years. If you are 27 years of age or older, have your blood pressure checked every year. You should have your blood pressure measured twice-once when you are at a hospital or clinic, and once when you are not at a hospital or clinic. Record the average of the two  measurements. To check your blood pressure when you are not at a hospital or clinic, you can use: ? An automated blood pressure machine at a pharmacy. ? A home blood pressure monitor.  If you are between 51 years and 24 years old, ask your health care provider if you should take aspirin to prevent strokes.  Have regular diabetes screenings. This involves taking a blood sample to check your fasting blood sugar level. ? If you are at a normal weight and have a low risk for diabetes, have this test once every three years after 56 years of age. ? If you are overweight and have a high risk for diabetes, consider being tested at a younger age or more often. Preventing infection Hepatitis B  If you have a higher risk for hepatitis B, you should be screened for this virus. You are considered at high risk for hepatitis B if: ? You were born in a country where hepatitis B is common. Ask your health care provider which countries are considered high risk. ? Your parents were born in a high-risk country, and you have not been immunized against hepatitis B (hepatitis B vaccine). ? You have HIV or AIDS. ? You use needles to inject street drugs. ? You live with someone who has hepatitis B. ? You have had sex with someone who has hepatitis B. ? You get hemodialysis treatment. ? You take certain medicines for conditions, including cancer, organ transplantation, and autoimmune conditions. Hepatitis C  Blood testing is recommended for: ? Everyone born from [birth year] through 1963. ? Anyone with known risk factors for hepatitis C. Sexually transmitted infections (STIs)  You should be screened for sexually transmitted infections (STIs) including gonorrhea and chlamydia if: ? You are sexually active and are younger than 56 years of age. ? You are older than 56 years of age and your health care provider tells you that you are at risk for this type of infection. ? Your sexual activity has changed since you were last  screened and you are at an increased risk for chlamydia or gonorrhea. Ask your health care provider if you are at risk.  If you do not have HIV, but are at risk, it may be recommended that you take a prescription medicine daily to prevent HIV infection. This is called pre-exposure prophylaxis (PrEP). You are considered at risk if: ? You are sexually active and do not regularly use condoms or know the HIV status of your  partner(s). ? You take drugs by injection. ? You are sexually active with a partner who has HIV. Talk with your health care provider about whether you are at high risk of being infected with HIV. If you choose to begin PrEP, you should first be tested for HIV. You should then be tested every 3 months for as long as you are taking PrEP. Pregnancy  If you are premenopausal and you may become pregnant, ask your health care provider about preconception counseling.  If you may become pregnant, take 400 to 800 micrograms (mcg) of folic acid every day.  If you want to prevent pregnancy, talk to your health care provider about birth control (contraception). Osteoporosis and menopause  Osteoporosis is a disease in which the bones lose minerals and strength with aging. This can result in serious bone fractures. Your risk for osteoporosis can be identified using a bone density scan.  If you are 63 years of age or older, or if you are at risk for osteoporosis and fractures, ask your health care provider if you should be screened.  Ask your health care provider whether you should take a calcium or vitamin D supplement to lower your risk for osteoporosis.  Menopause may have certain physical symptoms and risks.  Hormone replacement therapy may reduce some of these symptoms and risks. Talk to your health care provider about whether hormone replacement therapy is right for you. Follow these instructions at home:  Schedule regular health, dental, and eye exams.  Stay current with your  immunizations.  Do not use any tobacco products including cigarettes, chewing tobacco, or electronic cigarettes.  If you are pregnant, do not drink alcohol.  If you are breastfeeding, limit how much and how often you drink alcohol.  Limit alcohol intake to no more than 1 drink per day for nonpregnant women. One drink equals 12 ounces of beer, 5 ounces of wine, or 1 ounces of hard liquor.  Do not use street drugs.  Do not share needles.  Ask your health care provider for help if you need support or information about quitting drugs.  Tell your health care provider if you often feel depressed.  Tell your health care provider if you have ever been abused or do not feel safe at home. This information is not intended to replace advice given to you by your health care provider. Make sure you discuss any questions you have with your health care provider. Document Released: 03/06/2011 Document Revised: 01/27/2016 Document Reviewed: 05/25/2015 Elsevier Interactive Patient Education  2019 [employer].

## 2018-11-06 ENCOUNTER — Telehealth: Payer: Self-pay | Admitting: *Deleted

## 2018-11-06 NOTE — Telephone Encounter (Signed)
Left detailed message on personal voicemail that I have Xarelto samples for you. Please stop by the office to pick them up. Any questions please call office.

## 2018-11-07 NOTE — Telephone Encounter (Signed)
Pt came by office and picked up samples.

## 2019-04-08 ENCOUNTER — Encounter: Payer: Self-pay | Admitting: Physician Assistant

## 2019-04-09 ENCOUNTER — Encounter: Payer: Self-pay | Admitting: Physician Assistant

## 2019-09-02 ENCOUNTER — Other Ambulatory Visit: Payer: Self-pay | Admitting: Physician Assistant

## 2019-09-02 DIAGNOSIS — Z853 Personal history of malignant neoplasm of breast: Secondary | ICD-10-CM

## 2019-09-17 ENCOUNTER — Other Ambulatory Visit: Payer: Self-pay

## 2019-09-17 ENCOUNTER — Ambulatory Visit
Admission: RE | Admit: 2019-09-17 | Discharge: 2019-09-17 | Disposition: A | Discharge status: Home / Self Care | Payer: No Typology Code available for payment source | Source: Ambulatory Visit | Attending: Physician Assistant | Admitting: Physician Assistant

## 2019-09-17 DIAGNOSIS — Z853 Personal history of malignant neoplasm of breast: Secondary | ICD-10-CM

## 2019-10-17 ENCOUNTER — Other Ambulatory Visit: Payer: Self-pay | Admitting: Physician Assistant

## 2019-10-17 ENCOUNTER — Telehealth: Payer: Self-pay | Admitting: Physician Assistant

## 2019-10-17 DIAGNOSIS — Z13220 Encounter for screening for lipoid disorders: Secondary | ICD-10-CM

## 2019-10-17 DIAGNOSIS — Z136 Encounter for screening for cardiovascular disorders: Secondary | ICD-10-CM

## 2019-10-17 DIAGNOSIS — Z0000 Encounter for general adult medical examination without abnormal findings: Secondary | ICD-10-CM

## 2019-10-17 NOTE — Telephone Encounter (Signed)
Patient called to schedule a physical but she mentioned that she would like to know if Aldona Bar could put in lab orders for her to have labs done before her appointment time.

## 2019-10-17 NOTE — Telephone Encounter (Signed)
Scheduled for Monday 11/03/19 @ 8:45 am

## 2019-10-17 NOTE — Telephone Encounter (Signed)
Orders have been placed. Ok to schedule lab appointment prior to physical.

## 2019-10-17 NOTE — Telephone Encounter (Signed)
Please see message and advise if labs can be done prior to physical on 03/08.

## 2019-10-17 NOTE — Telephone Encounter (Signed)
Please call pt and schedule labs, orders are in per [facility].

## 2019-11-03 ENCOUNTER — Other Ambulatory Visit (INDEPENDENT_AMBULATORY_CARE_PROVIDER_SITE_OTHER): Payer: No Typology Code available for payment source

## 2019-11-03 ENCOUNTER — Other Ambulatory Visit: Payer: Self-pay

## 2019-11-03 DIAGNOSIS — Z0000 Encounter for general adult medical examination without abnormal findings: Secondary | ICD-10-CM

## 2019-11-03 DIAGNOSIS — Z136 Encounter for screening for cardiovascular disorders: Secondary | ICD-10-CM

## 2019-11-03 DIAGNOSIS — Z13220 Encounter for screening for lipoid disorders: Secondary | ICD-10-CM

## 2019-11-03 LAB — COMPREHENSIVE METABOLIC PANEL
ALT: 11 U/L (ref 0–35)
AST: 15 U/L (ref 0–37)
Albumin: 4 g/dL (ref 3.5–5.2)
Alkaline Phosphatase: 52 U/L (ref 39–117)
BUN: 20 mg/dL (ref 6–23)
CO2: 29 mEq/L (ref 19–32)
Calcium: 9.3 mg/dL (ref 8.4–10.5)
Chloride: 104 mEq/L (ref 96–112)
Creatinine, Ser: 0.77 mg/dL (ref 0.40–1.20)
GFR: 77.33 mL/min (ref >60.00)
Glucose, Bld: 87 mg/dL (ref 70–99)
Potassium: 4.3 mEq/L (ref 3.5–5.1)
Sodium: 139 mEq/L (ref 135–145)
Total Bilirubin: 0.7 mg/dL (ref 0.2–1.2)
Total Protein: 6.7 g/dL (ref 6.0–8.3)

## 2019-11-03 LAB — CBC WITH DIFFERENTIAL/PLATELET
Basophils Absolute: 0.1 10*3/uL (ref 0.0–0.1)
Basophils Relative: 1.3 % (ref 0.0–3.0)
Eosinophils Absolute: 0.1 10*3/uL (ref 0.0–0.7)
Eosinophils Relative: 1.8 % (ref 0.0–5.0)
HCT: 37.3 % (ref 36.0–46.0)
Hemoglobin: 12.5 g/dL (ref 12.0–15.0)
Lymphocytes Relative: 36 % (ref 12.0–46.0)
Lymphs Abs: 1.8 10*3/uL (ref 0.7–4.0)
MCHC: 33.6 g/dL (ref 30.0–36.0)
MCV: 94.8 fl (ref 78.0–100.0)
Monocytes Absolute: 0.5 10*3/uL (ref 0.1–1.0)
Monocytes Relative: 10.6 % (ref 3.0–12.0)
Neutro Abs: 2.6 10*3/uL (ref 1.4–7.7)
Neutrophils Relative %: 50.3 % (ref 43.0–77.0)
Platelets: 200 10*3/uL (ref 150.0–400.0)
RBC: 3.93 Mil/uL (ref 3.87–5.11)
RDW: 13 % (ref 11.5–15.5)
WBC: 5.1 10*3/uL (ref 4.0–10.5)

## 2019-11-03 LAB — LIPID PANEL
Cholesterol: 187 mg/dL (ref 0–200)
HDL: 79.2 mg/dL (ref >39.00)
LDL Cholesterol: 95 mg/dL (ref 0–99)
NonHDL: 107.36
Total CHOL/HDL Ratio: 2
Triglycerides: 61 mg/dL (ref 0.0–149.0)
VLDL: 12.2 mg/dL (ref 0.0–40.0)

## 2019-11-10 ENCOUNTER — Ambulatory Visit (INDEPENDENT_AMBULATORY_CARE_PROVIDER_SITE_OTHER): Payer: Self-pay | Admitting: Physician Assistant

## 2019-11-10 ENCOUNTER — Encounter: Payer: Self-pay | Admitting: Physician Assistant

## 2019-11-10 ENCOUNTER — Other Ambulatory Visit: Payer: Self-pay

## 2019-11-10 VITALS — BP 118/70 | HR 76 | Temp 97.7°F | Ht 67.0 in | Wt 132.5 lb

## 2019-11-10 DIAGNOSIS — Z0000 Encounter for general adult medical examination without abnormal findings: Secondary | ICD-10-CM

## 2019-11-10 NOTE — Patient Instructions (Signed)
It was great to see you!  Please go to the lab for blood work.   Our office will call you with your results unless you have chosen to receive results via MyChart.  If your blood work is normal we will follow-up each year for physicals and as scheduled for chronic medical problems.  If anything is abnormal we will treat accordingly and get you in for a follow-up.  Take care,  Odile Veloso    Health Maintenance, Female Adopting a healthy lifestyle and getting preventive care are important in promoting health and wellness. Ask your health care provider about:  The right schedule for you to have regular tests and exams.  Things you can do on your own to prevent diseases and keep yourself healthy. What should I know about diet, weight, and exercise? Eat a healthy diet   Eat a diet that includes plenty of vegetables, fruits, low-fat dairy products, and lean protein.  Do not eat a lot of foods that are high in solid fats, added sugars, or sodium. Maintain a healthy weight Body mass index (BMI) is used to identify weight problems. It estimates body fat based on height and weight. Your health care provider can help determine your BMI and help you achieve or maintain a healthy weight. Get regular exercise Get regular exercise. This is one of the most important things you can do for your health. Most adults should:  Exercise for at least 150 minutes each week. The exercise should increase your heart rate and make you sweat (moderate-intensity exercise).  Do strengthening exercises at least twice a week. This is in addition to the moderate-intensity exercise.  Spend less time sitting. Even light physical activity can be beneficial. Watch cholesterol and blood lipids Have your blood tested for lipids and cholesterol at 57 years of age, then have this test every 5 years. Have your cholesterol levels checked more often if:  Your lipid or cholesterol levels are high.  You are older than 57  years of age.  You are at high risk for heart disease. What should I know about cancer screening? Depending on your health history and family history, you may need to have cancer screening at various ages. This may include screening for:  Breast cancer.  Cervical cancer.  Colorectal cancer.  Skin cancer.  Lung cancer. What should I know about heart disease, diabetes, and high blood pressure? Blood pressure and heart disease  High blood pressure causes heart disease and increases the risk of stroke. This is more likely to develop in people who have high blood pressure readings, are of African descent, or are overweight.  Have your blood pressure checked: ? Every 3-5 years if you are 18-39 years of age. ? Every year if you are 40 years old or older. Diabetes Have regular diabetes screenings. This checks your fasting blood sugar level. Have the screening done:  Once every three years after age 40 if you are at a normal weight and have a low risk for diabetes.  More often and at a younger age if you are overweight or have a high risk for diabetes. What should I know about preventing infection? Hepatitis B If you have a higher risk for hepatitis B, you should be screened for this virus. Talk with your health care provider to find out if you are at risk for hepatitis B infection. Hepatitis C Testing is recommended for:  Everyone born from 1945 through 1965.  Anyone with known risk factors for hepatitis C. Sexually   transmitted infections (STIs)  Get screened for STIs, including gonorrhea and chlamydia, if: ? You are sexually active and are younger than 57 years of age. ? You are older than 57 years of age and your health care provider tells you that you are at risk for this type of infection. ? Your sexual activity has changed since you were last screened, and you are at increased risk for chlamydia or gonorrhea. Ask your health care provider if you are at risk.  Ask your health  care provider about whether you are at high risk for HIV. Your health care provider may recommend a prescription medicine to help prevent HIV infection. If you choose to take medicine to prevent HIV, you should first get tested for HIV. You should then be tested every 3 months for as long as you are taking the medicine. Pregnancy  If you are about to stop having your period (premenopausal) and you may become pregnant, seek counseling before you get pregnant.  Take 400 to 800 micrograms (mcg) of folic acid every day if you become pregnant.  Ask for birth control (contraception) if you want to prevent pregnancy. Osteoporosis and menopause Osteoporosis is a disease in which the bones lose minerals and strength with aging. This can result in bone fractures. If you are 65 years old or older, or if you are at risk for osteoporosis and fractures, ask your health care provider if you should:  Be screened for bone loss.  Take a calcium or vitamin D supplement to lower your risk of fractures.  Be given hormone replacement therapy (HRT) to treat symptoms of menopause. Follow these instructions at home: Lifestyle  Do not use any products that contain nicotine or tobacco, such as cigarettes, e-cigarettes, and chewing tobacco. If you need help quitting, ask your health care provider.  Do not use street drugs.  Do not share needles.  Ask your health care provider for help if you need support or information about quitting drugs. Alcohol use  Do not drink alcohol if: ? Your health care provider tells you not to drink. ? You are pregnant, may be pregnant, or are planning to become pregnant.  If you drink alcohol: ? Limit how much you use to 0-1 drink a day. ? Limit intake if you are breastfeeding.  Be aware of how much alcohol is in your drink. In the [place] one drink equals one 12 oz bottle of beer (355 mL), one 5 oz glass of wine (148 mL), or one 1 oz glass of hard liquor (44 mL). General  instructions  Schedule regular health, dental, and eye exams.  Stay current with your vaccines.  Tell your health care provider if: ? You often feel depressed. ? You have ever been abused or do not feel safe at home. Summary  Adopting a healthy lifestyle and getting preventive care are important in promoting health and wellness.  Follow your health care provider's instructions about healthy diet, exercising, and getting tested or screened for diseases.  Follow your health care provider's instructions on monitoring your cholesterol and blood pressure. This information is not intended to replace advice given to you by your health care provider. Make sure you discuss any questions you have with your health care provider. Document Revised: 08/14/2018 Document Reviewed: 08/14/2018 Elsevier Patient Education  2020 [employer]  

## 2019-11-10 NOTE — Progress Notes (Signed)
I acted as a Education administrator for [employer], PA-C Anselmo Pickler, LPN   Subjective:    Alyssa Riley is a 59 y.o. female and is here for a comprehensive physical exam.   HPI  There are no preventive care reminders to display for this patient.  Acute Concerns: None  Chronic Issues: None  Health Maintenance: Immunizations -- declined Tdap self pay Colonoscopy -- overdue, self pay Mammogram -- UTD PAP -- UTD, due 10/2020 Bone Density -- N/A Diet -- eats all food groups Sleep habits -- overall no issues Exercise -- playing tennis; now a boot camp instructor  Weight -- Weight: 132 lb 8 oz (60.1 kg)  Mood -- stable -- no concerns Weight history: Wt Readings from Last 10 Encounters:  11/10/19 132 lb 8 oz (60.1 kg)  11/05/18 124 lb 6.1 oz (56.4 kg)  10/25/17 145 lb 9.6 oz (66 kg)  10/16/17 145 lb 9.6 oz (66 kg)  08/10/17 147 lb 9.6 oz (67 kg)  03/21/17 144 lb 9.6 oz (65.6 kg)  02/19/17 145 lb (65.8 kg)  01/05/17 143 lb 12.8 oz (65.2 kg)  01/04/17 143 lb 8 oz (65.1 kg)  12/07/16 145 lb 9.6 oz (66 kg)   No LMP recorded. Patient is postmenopausal. Alcohol use: social use Tobacco use: none  Depression screen PHQ 2/9 11/10/2019  Decreased Interest 0  Down, Depressed, Hopeless 0  PHQ - 2 Score 0     Other providers/specialists: Patient Care Team: Inda Coke, [place] as PCP - General (Physician Assistant) Nicholas Lose, MD as Consulting Physician (Hematology and Oncology) Kyung Rudd, MD as Consulting Physician (Radiation Oncology) Donnie Mesa, MD as Consulting Physician (General Surgery) Delice Bison, Charlestine Massed, NP as Nurse Practitioner (Hematology and Oncology)    PMHx, SurgHx, SocialHx, Medications, and Allergies were reviewed in the Visit Navigator and updated as appropriate.   Past Medical History:  Diagnosis Date  . Breast cancer ([place]) 09/13/2016   left breast   . DVT (deep venous thrombosis) ([place]) 06/2016   Left calf  . Factor 5 Leiden mutation,  heterozygous ([place])   . Hypercholesteremia   . Personal history of radiation therapy 2018     Past Surgical History:  Procedure Laterality Date  . BREAST LUMPECTOMY Left 10/2016  . BREAST LUMPECTOMY WITH RADIOACTIVE SEED LOCALIZATION Left 10/10/2016   Procedure: LEFT BREAST LUMPECTOMY WITH RADIOACTIVE SEED LOCALIZATION;  Surgeon: Donnie Mesa, MD;  Location: [facility];  Service: General;  Laterality: Left;  . DILATION AND CURETTAGE OF UTERUS  1998  . FRACTURE SURGERY Left    foot at age 41     Family History  Adopted: Yes    Social History   Tobacco Use  . Smoking status: Never Smoker  . Smokeless tobacco: Never Used  Substance Use Topics  . Alcohol use: Yes    Comment: social   . Drug use: No    Review of Systems:   Review of Systems  Constitutional: Negative for chills, fever, malaise/fatigue and weight loss.  HENT: Negative for hearing loss, sinus pain and sore throat.   Respiratory: Negative for cough and hemoptysis.   Cardiovascular: Negative for chest pain, palpitations, leg swelling and PND.  Gastrointestinal: Negative for abdominal pain, constipation, diarrhea, heartburn, nausea and vomiting.  Genitourinary: Negative for dysuria, frequency and urgency.  Musculoskeletal: Negative for back pain, myalgias and neck pain.  Skin: Negative for itching and rash.  Neurological: Negative for dizziness, tingling, seizures and headaches.  Endo/Heme/Allergies: Negative for polydipsia.  Psychiatric/Behavioral: Negative  for depression. The patient is not nervous/anxious.        Objective:   BP 118/70 (BP Location: Left Arm, Patient Position: Sitting, Cuff Size: Normal)   Pulse 76   Temp 97.7 F (36.5 C) (Temporal)   Ht 5\' 7"  (1.702 m)   Wt 132 lb 8 oz (60.1 kg)   SpO2 99%   BMI 20.75 kg/m  Body mass index is 20.75 kg/m.   General Appearance:    Alert, cooperative, no distress, appears stated age  Head:    Normocephalic, without obvious  abnormality, atraumatic  Eyes:    PERRL, conjunctiva/corneas clear, EOM's intact, fundi    benign, both eyes  Ears:    Normal TM's and external ear canals, both ears  Nose:   Nares normal, septum midline, mucosa normal, no drainage    or sinus tenderness  Throat:   Lips, mucosa, and tongue normal; teeth and gums normal  Neck:   Supple, symmetrical, trachea midline, no adenopathy;    thyroid:  no enlargement/tenderness/nodules; no carotid   bruit or JVD  Back:     Symmetric, no curvature, ROM normal, no CVA tenderness  Lungs:     Clear to auscultation bilaterally, respirations unlabored  Chest Wall:    No tenderness or deformity   Heart:    Regular rate and rhythm, S1 and S2 normal, no murmur, rub or gallop  Breast Exam:    No tenderness, masses, or nipple abnormality  Abdomen:     Soft, non-tender, bowel sounds active all four quadrants,    no masses, no organomegaly  Genitalia:    Deferred  Extremities:   Extremities normal, atraumatic, no cyanosis or edema  Pulses:   2+ and symmetric all extremities  Skin:   Skin color, texture, turgor normal, no rashes or lesions  Lymph nodes:   Cervical, supraclavicular, and axillary nodes normal  Neurologic:   CNII-XII intact, normal strength, sensation and reflexes    throughout     Assessment/Plan:   Parnell was seen today for annual exam.  Diagnoses and all orders for this visit:  Routine physical examination   Today patient counseled on age appropriate routine health concerns for screening and prevention, each reviewed and up to date or declined. Immunizations reviewed and up to date or declined. Labs ordered and reviewed. Risk factors for depression reviewed and negative. Hearing function and visual acuity are intact. ADLs screened and addressed as needed. Functional ability and level of safety reviewed and appropriate. Education, counseling and referrals performed based on assessed risks today. Patient provided with a copy of  personalized plan for preventive services.   Well Adult Exam: Labs ordered: Yes. Patient counseling was done. See below for items discussed. Discussed the patient's BMI.  The BMI is in the acceptable range Follow up as needed for acute illness. Breast cancer screening: UTD. Cervical cancer screening: UTD   Patient Counseling: [x]    Nutrition: Stressed importance of moderation in sodium/caffeine intake, saturated fat and cholesterol, caloric balance, sufficient intake of fresh fruits, vegetables, fiber, calcium, iron, and 1 mg of folate supplement per day (for females capable of pregnancy).  [x]    Stressed the importance of regular exercise.   [x]    Substance Abuse: Discussed cessation/primary prevention of tobacco, alcohol, or other drug use; driving or other dangerous activities under the influence; availability of treatment for abuse.   [x]    Injury prevention: Discussed safety belts, safety helmets, smoke detector, smoking near bedding or upholstery.   [x]    Sexuality:  Discussed sexually transmitted diseases, partner selection, use of condoms, avoidance of unintended pregnancy  and contraceptive alternatives.  [x]    Dental health: Discussed importance of regular tooth brushing, flossing, and dental visits.  [x]    Health maintenance and immunizations reviewed. Please refer to Health maintenance section.   CMA or LPN served as scribe during this visit. History, Physical, and Plan performed by medical provider. The above documentation has been reviewed and is accurate and complete.  Inda Coke, PA-C [place]

## 2020-08-06 ENCOUNTER — Other Ambulatory Visit: Payer: Self-pay | Admitting: Physician Assistant

## 2020-08-06 DIAGNOSIS — Z9889 Other specified postprocedural states: Secondary | ICD-10-CM

## 2020-09-16 ENCOUNTER — Telehealth: Payer: Self-pay

## 2020-09-16 DIAGNOSIS — E7800 Pure hypercholesterolemia, unspecified: Secondary | ICD-10-CM

## 2020-09-16 DIAGNOSIS — Z0000 Encounter for general adult medical examination without abnormal findings: Secondary | ICD-10-CM

## 2020-09-16 NOTE — Telephone Encounter (Signed)
Patient is requesting to have labs done before her CPE  Please advise ?

## 2020-09-21 NOTE — Telephone Encounter (Signed)
Please call pt and schedule lab appt only orders are in. Please have her fast for labs.

## 2020-09-22 NOTE — Telephone Encounter (Signed)
Scheduled lab appt for pt

## 2020-09-23 ENCOUNTER — Other Ambulatory Visit: Payer: Self-pay

## 2020-09-23 ENCOUNTER — Ambulatory Visit
Admission: RE | Admit: 2020-09-23 | Discharge: 2020-09-23 | Disposition: A | Discharge status: Home / Self Care | Payer: No Typology Code available for payment source | Source: Ambulatory Visit | Attending: Physician Assistant | Admitting: Physician Assistant

## 2020-09-23 DIAGNOSIS — Z9889 Other specified postprocedural states: Secondary | ICD-10-CM

## 2020-10-20 ENCOUNTER — Other Ambulatory Visit: Payer: Self-pay

## 2020-10-22 ENCOUNTER — Encounter: Payer: Self-pay | Admitting: Physician Assistant

## 2020-11-08 ENCOUNTER — Other Ambulatory Visit (INDEPENDENT_AMBULATORY_CARE_PROVIDER_SITE_OTHER): Payer: Self-pay

## 2020-11-08 ENCOUNTER — Other Ambulatory Visit: Payer: Self-pay

## 2020-11-08 DIAGNOSIS — Z0000 Encounter for general adult medical examination without abnormal findings: Secondary | ICD-10-CM

## 2020-11-08 DIAGNOSIS — E7800 Pure hypercholesterolemia, unspecified: Secondary | ICD-10-CM

## 2020-11-08 LAB — CBC WITH DIFFERENTIAL/PLATELET
Basophils Absolute: 0 10*3/uL (ref 0.0–0.1)
Basophils Relative: 1 % (ref 0.0–3.0)
Eosinophils Absolute: 0.1 10*3/uL (ref 0.0–0.7)
Eosinophils Relative: 1.9 % (ref 0.0–5.0)
HCT: 36.4 % (ref 36.0–46.0)
Hemoglobin: 12.1 g/dL (ref 12.0–15.0)
Lymphocytes Relative: 36.7 % (ref 12.0–46.0)
Lymphs Abs: 1.6 10*3/uL (ref 0.7–4.0)
MCHC: 33.2 g/dL (ref 30.0–36.0)
MCV: 93.4 fl (ref 78.0–100.0)
Monocytes Absolute: 0.6 10*3/uL (ref 0.1–1.0)
Monocytes Relative: 13.3 % — ABNORMAL HIGH (ref 3.0–12.0)
Neutro Abs: 2 10*3/uL (ref 1.4–7.7)
Neutrophils Relative %: 47.1 % (ref 43.0–77.0)
Platelets: 195 10*3/uL (ref 150.0–400.0)
RBC: 3.9 Mil/uL (ref 3.87–5.11)
RDW: 13.4 % (ref 11.5–15.5)
WBC: 4.3 10*3/uL (ref 4.0–10.5)

## 2020-11-08 LAB — LIPID PANEL
Cholesterol: 174 mg/dL (ref 0–200)
HDL: 86.7 mg/dL (ref >39.00)
LDL Cholesterol: 77 mg/dL (ref 0–99)
NonHDL: 87.37
Total CHOL/HDL Ratio: 2
Triglycerides: 51 mg/dL (ref 0.0–149.0)
VLDL: 10.2 mg/dL (ref 0.0–40.0)

## 2020-11-08 LAB — COMPREHENSIVE METABOLIC PANEL
ALT: 11 U/L (ref 0–35)
AST: 16 U/L (ref 0–37)
Albumin: 4.1 g/dL (ref 3.5–5.2)
Alkaline Phosphatase: 59 U/L (ref 39–117)
BUN: 14 mg/dL (ref 6–23)
CO2: 28 mEq/L (ref 19–32)
Calcium: 9.1 mg/dL (ref 8.4–10.5)
Chloride: 104 mEq/L (ref 96–112)
Creatinine, Ser: 0.77 mg/dL (ref 0.40–1.20)
GFR: 85.41 mL/min (ref >60.00)
Glucose, Bld: 87 mg/dL (ref 70–99)
Potassium: 3.9 mEq/L (ref 3.5–5.1)
Sodium: 140 mEq/L (ref 135–145)
Total Bilirubin: 0.7 mg/dL (ref 0.2–1.2)
Total Protein: 6.9 g/dL (ref 6.0–8.3)

## 2020-11-12 ENCOUNTER — Encounter: Payer: Self-pay | Admitting: Physician Assistant

## 2020-11-12 ENCOUNTER — Other Ambulatory Visit: Payer: Self-pay

## 2020-11-12 ENCOUNTER — Other Ambulatory Visit (HOSPITAL_COMMUNITY)
Admission: RE | Admit: 2020-11-12 | Discharge: 2020-11-12 | Disposition: A | Discharge status: Home / Self Care | Payer: Self-pay | Source: Ambulatory Visit | Attending: Physician Assistant | Admitting: Physician Assistant

## 2020-11-12 ENCOUNTER — Ambulatory Visit (INDEPENDENT_AMBULATORY_CARE_PROVIDER_SITE_OTHER): Payer: Self-pay | Admitting: Physician Assistant

## 2020-11-12 VITALS — BP 110/68 | HR 82 | Temp 98.2°F | Ht 67.0 in | Wt 130.5 lb

## 2020-11-12 DIAGNOSIS — Z124 Encounter for screening for malignant neoplasm of cervix: Secondary | ICD-10-CM | POA: Insufficient documentation

## 2020-11-12 DIAGNOSIS — E7800 Pure hypercholesterolemia, unspecified: Secondary | ICD-10-CM

## 2020-11-12 DIAGNOSIS — Z0000 Encounter for general adult medical examination without abnormal findings: Secondary | ICD-10-CM

## 2020-11-12 NOTE — Progress Notes (Signed)
I acted as a Education administrator for [employer], PA-C Anselmo Pickler, LPN    Subjective:    Alyssa Riley is a 60 y.o. female and is here for a comprehensive physical exam.   HPI  Health Maintenance Due  Topic Date Due  . Hepatitis C Screening  Never done  . HIV Screening  Never done  . COLONOSCOPY (Pts 45-22yrs Insurance coverage will need to be confirmed)  Never done  . PAP SMEAR-Modifier  10/16/2020    Acute Concerns: None  Chronic Issues: HLD -- takes red yeast rice regularly. Lipid panel already checked prior to visit and LDL doing well, currently 77.  Health Maintenance: Immunizations -- overdue , self pay Colonoscopy -- overdue Mammogram -- UTD PAP -- due Bone Density -- N/A Diet -- eats very well, very clean Sleep habits -- no concerns Exercise -- continues to exercise daily, teaches classes at [employer] -- Weight: 130 lb 8 oz (59.2 kg)  Mood -- no concerns Weight history: Wt Readings from Last 10 Encounters:  11/12/20 130 lb 8 oz (59.2 kg)  11/10/19 132 lb 8 oz (60.1 kg)  11/05/18 124 lb 6.1 oz (56.4 kg)  10/25/17 145 lb 9.6 oz (66 kg)  10/16/17 145 lb 9.6 oz (66 kg)  08/10/17 147 lb 9.6 oz (67 kg)  03/21/17 144 lb 9.6 oz (65.6 kg)  02/19/17 145 lb (65.8 kg)  01/05/17 143 lb 12.8 oz (65.2 kg)  01/04/17 143 lb 8 oz (65.1 kg)   Body mass index is 20.44 kg/m. No LMP recorded. Patient is postmenopausal. Period characteristics: no vaginal bleeding Alcohol use:  Tobacco use:  Depression screen [facility] 2/9 11/10/2019  Decreased Interest 0  Down, Depressed, Hopeless 0  PHQ - 2 Score 0   UTD with dentist and eye doctor  Other providers/specialists: Patient Care Team: Inda Coke, [place] as PCP - General (Physician Assistant) Nicholas Lose, MD as Consulting Physician (Hematology and Oncology) Kyung Rudd, MD as Consulting Physician (Radiation Oncology) Donnie Mesa, MD as Consulting Physician (General Surgery) Delice Bison, Charlestine Massed, NP as  Nurse Practitioner (Hematology and Oncology)    PMHx, SurgHx, SocialHx, Medications, and Allergies were reviewed in the Visit Navigator and updated as appropriate.   Past Medical History:  Diagnosis Date  . Breast cancer ([place]) 09/13/2016   left breast   . DVT (deep venous thrombosis) ([place]) 06/2016   Left calf  . Factor 5 Leiden mutation, heterozygous ([place])   . Hypercholesteremia   . Personal history of radiation therapy 2018     Past Surgical History:  Procedure Laterality Date  . BREAST LUMPECTOMY Left 10/2016  . BREAST LUMPECTOMY WITH RADIOACTIVE SEED LOCALIZATION Left 10/10/2016   Procedure: LEFT BREAST LUMPECTOMY WITH RADIOACTIVE SEED LOCALIZATION;  Surgeon: Donnie Mesa, MD;  Location: [facility];  Service: General;  Laterality: Left;  . DILATION AND CURETTAGE OF UTERUS  1998  . FRACTURE SURGERY Left    foot at age 74     Family History  Adopted: Yes    Social History   Tobacco Use  . Smoking status: Never Smoker  . Smokeless tobacco: Never Used  Vaping Use  . Vaping Use: Never used  Substance Use Topics  . Alcohol use: Yes    Comment: social   . Drug use: No    Review of Systems:   ROS  Objective:    Body mass index is 20.44 kg/m.   General Appearance:    Alert, cooperative, no distress, appears stated age  Head:    Normocephalic, without obvious abnormality, atraumatic  Eyes:    PERRL, conjunctiva/corneas clear, EOM's intact, fundi    benign, both eyes  Ears:    Normal TM's and external ear canals, both ears  Nose:   Nares normal, septum midline, mucosa normal, no drainage    or sinus tenderness  Throat:   Lips, mucosa, and tongue normal; teeth and gums normal  Neck:   Supple, symmetrical, trachea midline, no adenopathy;    thyroid:  no enlargement/tenderness/nodules; no carotid   bruit or JVD  Back:     Symmetric, no curvature, ROM normal, no CVA tenderness  Lungs:     Clear to auscultation bilaterally, respirations unlabored   Chest Wall:    No tenderness or deformity   Heart:    Regular rate and rhythm, S1 and S2 normal, no murmur, rub or gallop  Breast Exam:    No tenderness, masses, or nipple abnormality  Abdomen:     Soft, non-tender, bowel sounds active all four quadrants,    no masses, no organomegaly  Genitalia:    Normal female without lesion, discharge or tenderness  Extremities:   Extremities normal, atraumatic, no cyanosis or edema  Pulses:   2+ and symmetric all extremities  Skin:   Skin color, texture, turgor normal, no rashes or lesions  Lymph nodes:   Cervical, supraclavicular, and axillary nodes normal  Neurologic:   CNII-XII intact, normal strength, sensation and reflexes    throughout   Results for orders placed or performed in visit on 11/08/20  Lipid panel  Result Value Ref Range   Cholesterol 174 0 - 200 mg/dL   Triglycerides 51.0 0.0 - 149.0 mg/dL   HDL 86.70 >39.00 mg/dL   VLDL 10.2 0.0 - 40.0 mg/dL   LDL Cholesterol 77 0 - 99 mg/dL   Total CHOL/HDL Ratio 2    NonHDL 87.37   Comprehensive metabolic panel  Result Value Ref Range   Sodium 140 135 - 145 mEq/L   Potassium 3.9 3.5 - 5.1 mEq/L   Chloride 104 96 - 112 mEq/L   CO2 28 19 - 32 mEq/L   Glucose, Bld 87 70 - 99 mg/dL   BUN 14 6 - 23 mg/dL   Creatinine, Ser 0.77 0.40 - 1.20 mg/dL   Total Bilirubin 0.7 0.2 - 1.2 mg/dL   Alkaline Phosphatase 59 39 - 117 U/L   AST 16 0 - 37 U/L   ALT 11 0 - 35 U/L   Total Protein 6.9 6.0 - 8.3 g/dL   Albumin 4.1 3.5 - 5.2 g/dL   GFR 85.41 >60.00 mL/min   Calcium 9.1 8.4 - 10.5 mg/dL  CBC with Differential/Platelet  Result Value Ref Range   WBC 4.3 4.0 - 10.5 K/uL   RBC 3.90 3.87 - 5.11 Mil/uL   Hemoglobin 12.1 12.0 - 15.0 g/dL   HCT 36.4 36.0 - 46.0 %   MCV 93.4 78.0 - 100.0 fl   MCHC 33.2 30.0 - 36.0 g/dL   RDW 13.4 11.5 - 15.5 %   Platelets 195.0 150.0 - 400.0 K/uL   Neutrophils Relative % 47.1 43.0 - 77.0 %   Lymphocytes Relative 36.7 12.0 - 46.0 %   Monocytes Relative 13.3  (H) 3.0 - 12.0 %   Eosinophils Relative 1.9 0.0 - 5.0 %   Basophils Relative 1.0 0.0 - 3.0 %   Neutro Abs 2.0 1.4 - 7.7 K/uL   Lymphs Abs 1.6 0.7 - 4.0 K/uL   Monocytes Absolute  0.6 0.1 - 1.0 K/uL   Eosinophils Absolute 0.1 0.0 - 0.7 K/uL   Basophils Absolute 0.0 0.0 - 0.1 K/uL    Assessment/Plan:   Alyssa Riley was seen today for annual exam.  Diagnoses and all orders for this visit:  Routine general medical examination at a health care facility Today patient counseled on age appropriate routine health concerns for screening and prevention, each reviewed and up to date or declined. Immunizations reviewed and up to date or declined. Labs ordered and reviewed. Risk factors for depression reviewed and negative. Hearing function and visual acuity are intact. ADLs screened and addressed as needed. Functional ability and level of safety reviewed and appropriate. Education, counseling and referrals performed based on assessed risks today. Patient provided with a copy of personalized plan for preventive services.  Hypercholesteremia Well controlled presently. Check annually.  Pap smear for cervical cancer screening -     Cytology - PAP    Well Adult Exam: Labs ordered: Yes. Patient counseling was done. See below for items discussed. Discussed the patient's BMI.  The BMI is in the acceptable range Follow up in one year. Breast cancer screening: UTD. Cervical cancer screening: performed today.   Patient Counseling: [x]    Nutrition: Stressed importance of moderation in sodium/caffeine intake, saturated fat and cholesterol, caloric balance, sufficient intake of fresh fruits, vegetables, fiber, calcium, iron, and 1 mg of folate supplement per day (for females capable of pregnancy).  [x]    Stressed the importance of regular exercise.   [x]    Substance Abuse: Discussed cessation/primary prevention of tobacco, alcohol, or other drug use; driving or other dangerous activities under the influence;  availability of treatment for abuse.   [x]    Injury prevention: Discussed safety belts, safety helmets, smoke detector, smoking near bedding or upholstery.   [x]    Sexuality: Discussed sexually transmitted diseases, partner selection, use of condoms, avoidance of unintended pregnancy  and contraceptive alternatives.  [x]    Dental health: Discussed importance of regular tooth brushing, flossing, and dental visits.  [x]    Health maintenance and immunizations reviewed. Please refer to Health maintenance section.   CMA or LPN served as scribe during this visit. History, Physical, and Plan performed by medical provider. The above documentation has been reviewed and is accurate and complete.  Inda Coke, PA-C [place]

## 2020-11-12 NOTE — Patient Instructions (Signed)
It was great to see you!  Keep up the good work!  Our office will call you with your PAP results unless you have chosen to receive results via MyChart.  If anything is abnormal we will treat accordingly and get you in for a follow-up.  Take care,  [facility] Maintenance, Female Adopting a healthy lifestyle and getting preventive care are important in promoting health and wellness. Ask your health care provider about:  The right schedule for you to have regular tests and exams.  Things you can do on your own to prevent diseases and keep yourself healthy. What should I know about diet, weight, and exercise? Eat a healthy diet  Eat a diet that includes plenty of vegetables, fruits, low-fat dairy products, and lean protein.  Do not eat a lot of foods that are high in solid fats, added sugars, or sodium.   Maintain a healthy weight Body mass index (BMI) is used to identify weight problems. It estimates body fat based on height and weight. Your health care provider can help determine your BMI and help you achieve or maintain a healthy weight. Get regular exercise Get regular exercise. This is one of the most important things you can do for your health. Most adults should:  Exercise for at least 150 minutes each week. The exercise should increase your heart rate and make you sweat (moderate-intensity exercise).  Do strengthening exercises at least twice a week. This is in addition to the moderate-intensity exercise.  Spend less time sitting. Even light physical activity can be beneficial. Watch cholesterol and blood lipids Have your blood tested for lipids and cholesterol at 58 years of age, then have this test every 5 years. Have your cholesterol levels checked more often if:  Your lipid or cholesterol levels are high.  You are older than 58 years of age.  You are at high risk for heart disease. What should I know about cancer screening? Depending on your health  history and family history, you may need to have cancer screening at various ages. This may include screening for:  Breast cancer.  Cervical cancer.  Colorectal cancer.  Skin cancer.  Lung cancer. What should I know about heart disease, diabetes, and high blood pressure? Blood pressure and heart disease  High blood pressure causes heart disease and increases the risk of stroke. This is more likely to develop in people who have high blood pressure readings, are of African descent, or are overweight.  Have your blood pressure checked: ? Every 3-5 years if you are 74-27 years of age. ? Every year if you are 32 years old or older. Diabetes Have regular diabetes screenings. This checks your fasting blood sugar level. Have the screening done:  Once every three years after age 76 if you are at a normal weight and have a low risk for diabetes.  More often and at a younger age if you are overweight or have a high risk for diabetes. What should I know about preventing infection? Hepatitis B If you have a higher risk for hepatitis B, you should be screened for this virus. Talk with your health care provider to find out if you are at risk for hepatitis B infection. Hepatitis C Testing is recommended for:  Everyone born from [birth year] through 1963.  Anyone with known risk factors for hepatitis C. Sexually transmitted infections (STIs)  Get screened for STIs, including gonorrhea and chlamydia, if: ? You are sexually active and are younger than 58  years of age. ? You are older than 58 years of age and your health care provider tells you that you are at risk for this type of infection. ? Your sexual activity has changed since you were last screened, and you are at increased risk for chlamydia or gonorrhea. Ask your health care provider if you are at risk.  Ask your health care provider about whether you are at high risk for HIV. Your health care provider may recommend a prescription medicine to  help prevent HIV infection. If you choose to take medicine to prevent HIV, you should first get tested for HIV. You should then be tested every 3 months for as long as you are taking the medicine. Pregnancy  If you are about to stop having your period (premenopausal) and you may become pregnant, seek counseling before you get pregnant.  Take 400 to 800 micrograms (mcg) of folic acid every day if you become pregnant.  Ask for birth control (contraception) if you want to prevent pregnancy. Osteoporosis and menopause Osteoporosis is a disease in which the bones lose minerals and strength with aging. This can result in bone fractures. If you are 89 years old or older, or if you are at risk for osteoporosis and fractures, ask your health care provider if you should:  Be screened for bone loss.  Take a calcium or vitamin D supplement to lower your risk of fractures.  Be given hormone replacement therapy (HRT) to treat symptoms of menopause. Follow these instructions at home: Lifestyle  Do not use any products that contain nicotine or tobacco, such as cigarettes, e-cigarettes, and chewing tobacco. If you need help quitting, ask your health care provider.  Do not use street drugs.  Do not share needles.  Ask your health care provider for help if you need support or information about quitting drugs. Alcohol use  Do not drink alcohol if: ? Your health care provider tells you not to drink. ? You are pregnant, may be pregnant, or are planning to become pregnant.  If you drink alcohol: ? Limit how much you use to 0-1 drink a day. ? Limit intake if you are breastfeeding.  Be aware of how much alcohol is in your drink. In the [place] one drink equals one 12 oz bottle of beer (355 mL), one 5 oz glass of wine (148 mL), or one 1 oz glass of hard liquor (44 mL). General instructions  Schedule regular health, dental, and eye exams.  Stay current with your vaccines.  Tell your health care  provider if: ? You often feel depressed. ? You have ever been abused or do not feel safe at home. Summary  Adopting a healthy lifestyle and getting preventive care are important in promoting health and wellness.  Follow your health care provider's instructions about healthy diet, exercising, and getting tested or screened for diseases.  Follow your health care provider's instructions on monitoring your cholesterol and blood pressure. This information is not intended to replace advice given to you by your health care provider. Make sure you discuss any questions you have with your health care provider. Document Revised: 08/14/2018 Document Reviewed: 08/14/2018 Elsevier Patient Education  2021 [employer].

## 2020-11-16 LAB — CYTOLOGY - PAP
Comment: NEGATIVE
Diagnosis: NEGATIVE
High risk HPV: NEGATIVE

## 2021-04-12 ENCOUNTER — Emergency Department (HOSPITAL_BASED_OUTPATIENT_CLINIC_OR_DEPARTMENT_OTHER): Payer: Self-pay

## 2021-04-12 ENCOUNTER — Other Ambulatory Visit: Payer: Self-pay

## 2021-04-12 ENCOUNTER — Observation Stay (HOSPITAL_BASED_OUTPATIENT_CLINIC_OR_DEPARTMENT_OTHER)
Admission: EM | Admit: 2021-04-12 | Discharge: 2021-04-13 | Disposition: A | Discharge status: Home / Self Care | Payer: Self-pay | Attending: Surgery | Admitting: Surgery

## 2021-04-12 ENCOUNTER — Observation Stay (HOSPITAL_COMMUNITY): Payer: Self-pay | Admitting: Anesthesiology

## 2021-04-12 ENCOUNTER — Encounter (HOSPITAL_COMMUNITY): Admission: EM | Disposition: A | Discharge status: Home / Self Care | Payer: Self-pay | Source: Home / Self Care

## 2021-04-12 ENCOUNTER — Encounter (HOSPITAL_BASED_OUTPATIENT_CLINIC_OR_DEPARTMENT_OTHER): Payer: Self-pay

## 2021-04-12 DIAGNOSIS — Z20822 Contact with and (suspected) exposure to covid-19: Secondary | ICD-10-CM | POA: Insufficient documentation

## 2021-04-12 DIAGNOSIS — Z853 Personal history of malignant neoplasm of breast: Secondary | ICD-10-CM | POA: Insufficient documentation

## 2021-04-12 DIAGNOSIS — K8000 Calculus of gallbladder with acute cholecystitis without obstruction: Secondary | ICD-10-CM | POA: Diagnosis present

## 2021-04-12 DIAGNOSIS — K8021 Calculus of gallbladder without cholecystitis with obstruction: Secondary | ICD-10-CM

## 2021-04-12 DIAGNOSIS — K8012 Calculus of gallbladder with acute and chronic cholecystitis without obstruction: Principal | ICD-10-CM | POA: Insufficient documentation

## 2021-04-12 DIAGNOSIS — K8020 Calculus of gallbladder without cholecystitis without obstruction: Secondary | ICD-10-CM

## 2021-04-12 HISTORY — PX: CHOLECYSTECTOMY: SHX55

## 2021-04-12 LAB — CBC WITH DIFFERENTIAL/PLATELET
Abs Immature Granulocytes: 0.02 10*3/uL (ref 0.00–0.07)
Basophils Absolute: 0.1 10*3/uL (ref 0.0–0.1)
Basophils Relative: 1 %
Eosinophils Absolute: 0 10*3/uL (ref 0.0–0.5)
Eosinophils Relative: 0 %
HCT: 39.1 % (ref 36.0–46.0)
Hemoglobin: 13 g/dL (ref 12.0–15.0)
Immature Granulocytes: 0 %
Lymphocytes Relative: 11 %
Lymphs Abs: 1.1 10*3/uL (ref 0.7–4.0)
MCH: 31.4 pg (ref 26.0–34.0)
MCHC: 33.2 g/dL (ref 30.0–36.0)
MCV: 94.4 fL (ref 80.0–100.0)
Monocytes Absolute: 0.5 10*3/uL (ref 0.1–1.0)
Monocytes Relative: 5 %
Neutro Abs: 8.1 10*3/uL — ABNORMAL HIGH (ref 1.7–7.7)
Neutrophils Relative %: 83 %
Platelets: 176 10*3/uL (ref 150–400)
RBC: 4.14 MIL/uL (ref 3.87–5.11)
RDW: 12.7 % (ref 11.5–15.5)
WBC: 9.7 10*3/uL (ref 4.0–10.5)
nRBC: 0 % (ref 0.0–0.2)

## 2021-04-12 LAB — LIPASE, BLOOD: Lipase: 30 U/L (ref 11–51)

## 2021-04-12 LAB — URINALYSIS, ROUTINE W REFLEX MICROSCOPIC
Bilirubin Urine: NEGATIVE
Glucose, UA: NEGATIVE mg/dL
Hgb urine dipstick: NEGATIVE
Ketones, ur: 15 mg/dL — AB
Nitrite: NEGATIVE
Specific Gravity, Urine: 1.026 (ref 1.005–1.030)
pH: 6.5 (ref 5.0–8.0)

## 2021-04-12 LAB — COMPREHENSIVE METABOLIC PANEL
ALT: 13 U/L (ref 0–44)
AST: 21 U/L (ref 15–41)
Albumin: 4.4 g/dL (ref 3.5–5.0)
Alkaline Phosphatase: 56 U/L (ref 38–126)
Anion gap: 12 (ref 5–15)
BUN: 16 mg/dL (ref 6–20)
CO2: 24 mmol/L (ref 22–32)
Calcium: 9.4 mg/dL (ref 8.9–10.3)
Chloride: 98 mmol/L (ref 98–111)
Creatinine, Ser: 0.68 mg/dL (ref 0.44–1.00)
GFR, Estimated: >60 mL/min (ref >60)
Glucose, Bld: 122 mg/dL — ABNORMAL HIGH (ref 70–99)
Potassium: 3.7 mmol/L (ref 3.5–5.1)
Sodium: 134 mmol/L — ABNORMAL LOW (ref 135–145)
Total Bilirubin: 0.6 mg/dL (ref 0.3–1.2)
Total Protein: 7.8 g/dL (ref 6.5–8.1)

## 2021-04-12 LAB — RESP PANEL BY RT-PCR (FLU A&B, COVID) ARPGX2
Influenza A by PCR: NEGATIVE
Influenza B by PCR: NEGATIVE
SARS Coronavirus 2 by RT PCR: NEGATIVE

## 2021-04-12 LAB — SURGICAL PCR SCREEN
MRSA, PCR: NEGATIVE
Staphylococcus aureus: NEGATIVE

## 2021-04-12 LAB — HIV ANTIBODY (ROUTINE TESTING W REFLEX): HIV Screen 4th Generation wRfx: NONREACTIVE

## 2021-04-12 SURGERY — LAPAROSCOPIC CHOLECYSTECTOMY WITH INTRAOPERATIVE CHOLANGIOGRAM
Anesthesia: General

## 2021-04-12 MED ORDER — DEXAMETHASONE SODIUM PHOSPHATE 10 MG/ML IJ SOLN
INTRAMUSCULAR | Status: DC | PRN
  Administered 2021-04-12: 10 mg via INTRAVENOUS

## 2021-04-12 MED ORDER — ONDANSETRON 4 MG PO TBDP: 4.0000 mg | ORAL_TABLET | Freq: Four times a day (QID) | ORAL | Status: DC | PRN

## 2021-04-12 MED ORDER — OXYCODONE HCL 5 MG PO TABS: 5.0000 mg | ORAL_TABLET | Freq: Once | ORAL | Status: DC | PRN

## 2021-04-12 MED ORDER — OXYCODONE HCL 5 MG PO TABS
5.0000 mg | ORAL_TABLET | Freq: Four times a day (QID) | ORAL | Status: DC | PRN
  Administered 2021-04-12 – 2021-04-13 (×2): 5 mg via ORAL
  Filled 2021-04-12 (×2): qty 1

## 2021-04-12 MED ORDER — CHLORHEXIDINE GLUCONATE 0.12 % MT SOLN
OROMUCOSAL | Status: AC
  Administered 2021-04-12: 15 mL
  Filled 2021-04-12: qty 15

## 2021-04-12 MED ORDER — SODIUM CHLORIDE 0.9 % IV BOLUS
1000.0000 mL | Freq: Once | INTRAVENOUS | Status: AC
  Administered 2021-04-12: 1000 mL via INTRAVENOUS

## 2021-04-12 MED ORDER — SODIUM CHLORIDE 0.9 % IV SOLN: INTRAVENOUS | Status: DC

## 2021-04-12 MED ORDER — LIDOCAINE 2% (20 MG/ML) 5 ML SYRINGE
INTRAMUSCULAR | Status: AC
  Filled 2021-04-12: qty 20

## 2021-04-12 MED ORDER — ACETAMINOPHEN 500 MG PO TABS: 1000.0000 mg | ORAL_TABLET | ORAL | Status: DC

## 2021-04-12 MED ORDER — LIDOCAINE 2% (20 MG/ML) 5 ML SYRINGE
INTRAMUSCULAR | Status: DC | PRN
  Administered 2021-04-12: 100 mg via INTRAVENOUS

## 2021-04-12 MED ORDER — MIDAZOLAM HCL 2 MG/2ML IJ SOLN
INTRAMUSCULAR | Status: DC | PRN
  Administered 2021-04-12: 2 mg via INTRAVENOUS

## 2021-04-12 MED ORDER — HEPARIN SODIUM (PORCINE) 5000 UNIT/ML IJ SOLN
5000.0000 [IU] | Freq: Three times a day (TID) | INTRAMUSCULAR | Status: DC
  Administered 2021-04-12 – 2021-04-13 (×2): 5000 [IU] via SUBCUTANEOUS
  Filled 2021-04-12 (×2): qty 1

## 2021-04-12 MED ORDER — PROPOFOL 10 MG/ML IV BOLUS
INTRAVENOUS | Status: DC | PRN
  Administered 2021-04-12: 150 mg via INTRAVENOUS

## 2021-04-12 MED ORDER — IOHEXOL 350 MG/ML SOLN
100.0000 mL | Freq: Once | INTRAVENOUS | Status: AC | PRN
  Administered 2021-04-12: 60 mL via INTRAVENOUS

## 2021-04-12 MED ORDER — INDOCYANINE GREEN 25 MG IV SOLR
7.5000 mg | INTRAVENOUS | Status: DC
  Filled 2021-04-12: qty 10

## 2021-04-12 MED ORDER — ACETAMINOPHEN 500 MG PO TABS
1000.0000 mg | ORAL_TABLET | Freq: Four times a day (QID) | ORAL | Status: DC
  Administered 2021-04-12 – 2021-04-13 (×3): 1000 mg via ORAL
  Filled 2021-04-12 (×4): qty 2

## 2021-04-12 MED ORDER — ONDANSETRON HCL 4 MG/2ML IJ SOLN
INTRAMUSCULAR | Status: AC
  Filled 2021-04-12: qty 6

## 2021-04-12 MED ORDER — HEPARIN SODIUM (PORCINE) 5000 UNIT/ML IJ SOLN
5000.0000 [IU] | INTRAMUSCULAR | Status: AC
  Administered 2021-04-12: 5000 [IU] via SUBCUTANEOUS
  Filled 2021-04-12: qty 1

## 2021-04-12 MED ORDER — FENTANYL CITRATE (PF) 250 MCG/5ML IJ SOLN
INTRAMUSCULAR | Status: AC
  Filled 2021-04-12: qty 5

## 2021-04-12 MED ORDER — MIDAZOLAM HCL 2 MG/2ML IJ SOLN
INTRAMUSCULAR | Status: AC
  Filled 2021-04-12: qty 2

## 2021-04-12 MED ORDER — SUCCINYLCHOLINE CHLORIDE 200 MG/10ML IV SOSY
PREFILLED_SYRINGE | INTRAVENOUS | Status: AC
  Filled 2021-04-12: qty 30

## 2021-04-12 MED ORDER — ROCURONIUM BROMIDE 10 MG/ML (PF) SYRINGE
PREFILLED_SYRINGE | INTRAVENOUS | Status: AC
  Filled 2021-04-12: qty 10

## 2021-04-12 MED ORDER — SUGAMMADEX SODIUM 200 MG/2ML IV SOLN
INTRAVENOUS | Status: DC | PRN
  Administered 2021-04-12: 150 mg via INTRAVENOUS

## 2021-04-12 MED ORDER — HYDROMORPHONE HCL 1 MG/ML IJ SOLN
INTRAMUSCULAR | Status: AC
  Filled 2021-04-12: qty 1

## 2021-04-12 MED ORDER — INDOCYANINE GREEN 25 MG IV SOLR
INTRAVENOUS | Status: AC
  Filled 2021-04-12: qty 10

## 2021-04-12 MED ORDER — MORPHINE SULFATE (PF) 4 MG/ML IV SOLN
4.0000 mg | Freq: Once | INTRAVENOUS | Status: AC
  Administered 2021-04-12: 4 mg via INTRAVENOUS
  Filled 2021-04-12: qty 1

## 2021-04-12 MED ORDER — DIPHENHYDRAMINE HCL 50 MG/ML IJ SOLN: 25.0000 mg | Freq: Four times a day (QID) | INTRAMUSCULAR | Status: DC | PRN

## 2021-04-12 MED ORDER — ACETAMINOPHEN 500 MG PO TABS
1000.0000 mg | ORAL_TABLET | ORAL | Status: AC
  Administered 2021-04-12: 1000 mg via ORAL
  Filled 2021-04-12: qty 2

## 2021-04-12 MED ORDER — FENTANYL CITRATE (PF) 250 MCG/5ML IJ SOLN
INTRAMUSCULAR | Status: DC | PRN
  Administered 2021-04-12 (×3): 50 ug via INTRAVENOUS

## 2021-04-12 MED ORDER — BUPIVACAINE-EPINEPHRINE 0.25% -1:200000 IJ SOLN
INTRAMUSCULAR | Status: DC | PRN
  Administered 2021-04-12: 10 mL

## 2021-04-12 MED ORDER — BUPIVACAINE-EPINEPHRINE (PF) 0.25% -1:200000 IJ SOLN
INTRAMUSCULAR | Status: AC
  Filled 2021-04-12: qty 30

## 2021-04-12 MED ORDER — SCOPOLAMINE 1 MG/3DAYS TD PT72
1.0000 | MEDICATED_PATCH | TRANSDERMAL | Status: DC
  Administered 2021-04-12: 1.5 mg via TRANSDERMAL
  Filled 2021-04-12: qty 1

## 2021-04-12 MED ORDER — EPHEDRINE 5 MG/ML INJ
INTRAVENOUS | Status: AC
  Filled 2021-04-12: qty 5

## 2021-04-12 MED ORDER — PHENYLEPHRINE 40 MCG/ML (10ML) SYRINGE FOR IV PUSH (FOR BLOOD PRESSURE SUPPORT)
PREFILLED_SYRINGE | INTRAVENOUS | Status: AC
  Filled 2021-04-12: qty 10

## 2021-04-12 MED ORDER — HYDROMORPHONE HCL 1 MG/ML IJ SOLN
0.5000 mg | INTRAMUSCULAR | Status: DC | PRN
  Administered 2021-04-12 (×2): 0.5 mg via INTRAVENOUS
  Filled 2021-04-12 (×2): qty 1

## 2021-04-12 MED ORDER — PROPOFOL 10 MG/ML IV BOLUS
INTRAVENOUS | Status: AC
  Filled 2021-04-12: qty 20

## 2021-04-12 MED ORDER — ONDANSETRON HCL 4 MG/2ML IJ SOLN: 4.0000 mg | Freq: Three times a day (TID) | INTRAMUSCULAR | Status: DC | PRN

## 2021-04-12 MED ORDER — HYDROMORPHONE HCL 1 MG/ML IJ SOLN
0.5000 mg | INTRAMUSCULAR | Status: DC | PRN
  Administered 2021-04-12 (×3): 0.5 mg via INTRAVENOUS
  Filled 2021-04-12 (×3): qty 0.5

## 2021-04-12 MED ORDER — ONDANSETRON HCL 4 MG/2ML IJ SOLN: 4.0000 mg | Freq: Once | INTRAMUSCULAR | Status: DC | PRN

## 2021-04-12 MED ORDER — ONDANSETRON HCL 4 MG/2ML IJ SOLN
4.0000 mg | Freq: Once | INTRAMUSCULAR | Status: AC
  Administered 2021-04-12: 4 mg via INTRAVENOUS
  Filled 2021-04-12: qty 2

## 2021-04-12 MED ORDER — DEXAMETHASONE SODIUM PHOSPHATE 10 MG/ML IJ SOLN
INTRAMUSCULAR | Status: AC
  Filled 2021-04-12: qty 1

## 2021-04-12 MED ORDER — ROCURONIUM BROMIDE 10 MG/ML (PF) SYRINGE
PREFILLED_SYRINGE | INTRAVENOUS | Status: AC
  Filled 2021-04-12: qty 30

## 2021-04-12 MED ORDER — KETOROLAC TROMETHAMINE 30 MG/ML IJ SOLN: 30.0000 mg | Freq: Once | INTRAMUSCULAR | Status: DC | PRN

## 2021-04-12 MED ORDER — ROCURONIUM BROMIDE 10 MG/ML (PF) SYRINGE
PREFILLED_SYRINGE | INTRAVENOUS | Status: DC | PRN
  Administered 2021-04-12: 60 mg via INTRAVENOUS

## 2021-04-12 MED ORDER — SODIUM CHLORIDE 0.9 % IV SOLN
2.0000 g | INTRAVENOUS | Status: DC
  Administered 2021-04-12: 2 g via INTRAVENOUS
  Filled 2021-04-12 (×3): qty 20

## 2021-04-12 MED ORDER — OXYCODONE HCL 5 MG/5ML PO SOLN: 5.0000 mg | Freq: Once | ORAL | Status: DC | PRN

## 2021-04-12 MED ORDER — LACTATED RINGERS IV SOLN: INTRAVENOUS | Status: DC

## 2021-04-12 MED ORDER — INDOCYANINE GREEN 25 MG IV SOLR
INTRAVENOUS | Status: DC | PRN
  Administered 2021-04-12: 7.5 mg via INTRAVENOUS

## 2021-04-12 MED ORDER — SODIUM CHLORIDE 0.9 % IR SOLN
Status: DC | PRN
  Administered 2021-04-12: 1000 mL

## 2021-04-12 MED ORDER — HYDROMORPHONE HCL 1 MG/ML IJ SOLN
0.2500 mg | INTRAMUSCULAR | Status: DC | PRN
  Administered 2021-04-12: 0.5 mg via INTRAVENOUS

## 2021-04-12 MED ORDER — ONDANSETRON HCL 4 MG/2ML IJ SOLN: 4.0000 mg | Freq: Four times a day (QID) | INTRAMUSCULAR | Status: DC | PRN

## 2021-04-12 MED ORDER — DIPHENHYDRAMINE HCL 25 MG PO CAPS: 25.0000 mg | ORAL_CAPSULE | Freq: Four times a day (QID) | ORAL | Status: DC | PRN

## 2021-04-12 MED ORDER — 0.9 % SODIUM CHLORIDE (POUR BTL) OPTIME
TOPICAL | Status: DC | PRN
  Administered 2021-04-12: 1000 mL

## 2021-04-12 MED ORDER — DEXAMETHASONE SODIUM PHOSPHATE 10 MG/ML IJ SOLN
INTRAMUSCULAR | Status: AC
  Filled 2021-04-12: qty 3

## 2021-04-12 MED ORDER — ONDANSETRON HCL 4 MG/2ML IJ SOLN
INTRAMUSCULAR | Status: DC | PRN
  Administered 2021-04-12: 4 mg via INTRAVENOUS

## 2021-04-12 SURGICAL SUPPLY — 43 items
APPLIER CLIP 5 13 M/L LIGAMAX5 (MISCELLANEOUS) ×2
BAG COUNTER SPONGE SURGICOUNT (BAG) ×2 IMPLANT
BLADE CLIPPER SURG (BLADE) IMPLANT
CANISTER SUCT 3000ML PPV (MISCELLANEOUS) ×2 IMPLANT
CHLORAPREP W/TINT 26 (MISCELLANEOUS) ×2 IMPLANT
CLIP APPLIE 5 13 M/L LIGAMAX5 (MISCELLANEOUS) ×1 IMPLANT
CLSR STERI-STRIP ANTIMIC 1/2X4 (GAUZE/BANDAGES/DRESSINGS) ×2 IMPLANT
COVER MAYO STAND STRL (DRAPES) IMPLANT
COVER SURGICAL LIGHT HANDLE (MISCELLANEOUS) ×2 IMPLANT
DERMABOND ADHESIVE PROPEN (GAUZE/BANDAGES/DRESSINGS) ×1
DERMABOND ADVANCED (GAUZE/BANDAGES/DRESSINGS) ×1
DERMABOND ADVANCED .7 DNX12 (GAUZE/BANDAGES/DRESSINGS) ×1 IMPLANT
DERMABOND ADVANCED .7 DNX6 (GAUZE/BANDAGES/DRESSINGS) ×1 IMPLANT
DRAPE C-ARM 42X120 X-RAY (DRAPES) IMPLANT
ELECT REM PT RETURN 9FT ADLT (ELECTROSURGICAL) ×2
ELECTRODE REM PT RTRN 9FT ADLT (ELECTROSURGICAL) ×1 IMPLANT
GLOVE SURG ENC MOIS LTX SZ7 (GLOVE) ×2 IMPLANT
GLOVE SURG UNDER POLY LF SZ7.5 (GLOVE) ×2 IMPLANT
GOWN STRL REUS W/ TWL LRG LVL3 (GOWN DISPOSABLE) ×3 IMPLANT
GOWN STRL REUS W/TWL LRG LVL3 (GOWN DISPOSABLE) ×3
GRASPER SUT TROCAR 14GX15 (MISCELLANEOUS) ×2 IMPLANT
HEMOSTAT SNOW SURGICEL 2X4 (HEMOSTASIS) ×2 IMPLANT
KIT BASIN OR (CUSTOM PROCEDURE TRAY) ×2 IMPLANT
KIT TURNOVER KIT B (KITS) ×2 IMPLANT
NS IRRIG 1000ML POUR BTL (IV SOLUTION) ×2 IMPLANT
PAD ARMBOARD 7.5X6 YLW CONV (MISCELLANEOUS) ×2 IMPLANT
POUCH RETRIEVAL ECOSAC 10 (ENDOMECHANICALS) ×1 IMPLANT
POUCH RETRIEVAL ECOSAC 10MM (ENDOMECHANICALS) ×1
SCISSORS LAP 5X35 DISP (ENDOMECHANICALS) ×2 IMPLANT
SET CHOLANGIOGRAPH 5 50 .035 (SET/KITS/TRAYS/PACK) IMPLANT
SET IRRIG TUBING LAPAROSCOPIC (IRRIGATION / IRRIGATOR) ×2 IMPLANT
SET TUBE SMOKE EVAC HIGH FLOW (TUBING) ×2 IMPLANT
SLEEVE ENDOPATH XCEL 5M (ENDOMECHANICALS) ×4 IMPLANT
SPECIMEN JAR SMALL (MISCELLANEOUS) ×2 IMPLANT
STRIP CLOSURE SKIN 1/2X4 (GAUZE/BANDAGES/DRESSINGS) ×2 IMPLANT
SUT MNCRL AB 4-0 PS2 18 (SUTURE) ×2 IMPLANT
SUT VICRYL 0 UR6 27IN ABS (SUTURE) ×2 IMPLANT
TOWEL GREEN STERILE (TOWEL DISPOSABLE) ×2 IMPLANT
TOWEL GREEN STERILE FF (TOWEL DISPOSABLE) ×2 IMPLANT
TRAY LAPAROSCOPIC MC (CUSTOM PROCEDURE TRAY) ×2 IMPLANT
TROCAR XCEL BLUNT TIP 100MML (ENDOMECHANICALS) ×2 IMPLANT
TROCAR XCEL NON-BLD 5MMX100MML (ENDOMECHANICALS) ×2 IMPLANT
WATER STERILE IRR 1000ML POUR (IV SOLUTION) ×2 IMPLANT

## 2021-04-12 NOTE — Plan of Care (Signed)

## 2021-04-12 NOTE — ED Triage Notes (Signed)
Pt reports lower abdominal pain that wraps around to all lower back. Pain has been increasing since 1700 last night while driving home from work. 1 episode vomiting at 2100. Denies n/ d.

## 2021-04-12 NOTE — Anesthesia Postprocedure Evaluation (Signed)
Anesthesia Post Note  Patient: Alyssa Riley  Procedure(s) Performed: LAPAROSCOPIC CHOLECYSTECTOMY     Patient location during evaluation: PACU Anesthesia Type: General Level of consciousness: awake and alert Pain management: pain level controlled Vital Signs Assessment: post-procedure vital signs reviewed and stable Respiratory status: spontaneous breathing, nonlabored ventilation, respiratory function stable and patient connected to nasal cannula oxygen Cardiovascular status: blood pressure returned to baseline and stable Postop Assessment: no apparent nausea or vomiting Anesthetic complications: no   No notable events documented.  Last Vitals:  Vitals:   04/12/21 1725 04/12/21 1742  BP: 121/70 115/70  Pulse: 62 (!) 56  Resp: 15 16  Temp: 36.9 C 36.8 C  SpO2: 98% 100%    Last Pain:  Vitals:   04/12/21 1742  TempSrc: Oral  PainSc:                  Yeraldin Litzenberger L Shaunice Levitan

## 2021-04-12 NOTE — Anesthesia Preprocedure Evaluation (Signed)
Anesthesia Evaluation  Patient identified by MRN, date of birth, ID band Patient awake    Reviewed: Allergy & Precautions, NPO status , Patient's Chart, lab work & pertinent test results  Airway Mallampati: II  TM Distance: >3 FB Neck ROM: Full    Dental no notable dental hx.    Pulmonary neg pulmonary ROS,    Pulmonary exam normal breath sounds clear to auscultation       Cardiovascular negative cardio ROS Normal cardiovascular exam Rhythm:Regular Rate:Normal     Neuro/Psych negative neurological ROS  negative psych ROS   GI/Hepatic negative GI ROS, Neg liver ROS,   Endo/Other  negative endocrine ROS  Renal/GU negative Renal ROS  negative genitourinary   Musculoskeletal negative musculoskeletal ROS (+)   Abdominal   Peds negative pediatric ROS (+)  Hematology  (+) Blood dyscrasia, , Factor V Leiden    Anesthesia Other Findings   Reproductive/Obstetrics negative OB ROS                             Anesthesia Physical Anesthesia Plan  ASA: 2  Anesthesia Plan: General   Post-op Pain Management:    Induction: Intravenous  PONV Risk Score and Plan: 3 and Ondansetron, Dexamethasone and Treatment may vary due to age or medical condition  Airway Management Planned: Oral ETT  Additional Equipment:   Intra-op Plan:   Post-operative Plan: Extubation in OR  Informed Consent: I have reviewed the patients History and Physical, chart, labs and discussed the procedure including the risks, benefits and alternatives for the proposed anesthesia with the patient or authorized representative who has indicated his/her understanding and acceptance.     Dental advisory given  Plan Discussed with: CRNA and Surgeon  Anesthesia Plan Comments:         Anesthesia Quick Evaluation

## 2021-04-12 NOTE — H&P (Signed)
[facility] Surgery Admission Note  Alyssa Riley [birth year]  Reynolds:[id number].    Requesting MD: [place] Chief Complaint: abdominal and back pain with vomiting Reason for Consult: cholelithiasis/cholecystitis  HPI:  Pt presented to ED at [facility] at ~1AM this morning complaining of abdominal pain that goes to her back.  Pain started around 1700 hrs last PM, while driving home from work.  Pain has increased with one episode of emesis.    PMH includes left breast cancer with lumpectomy  2018, and radiation Rx, Factor 5 Leiden mutation DVT 2017, hypercholesteremia.   Work up shows she was afebrile, BP up on admission, VSS.  CMP is within normal limits, lipase 30, WBC 9.32mH/H 13/39, platelets 176K, respiratory panel is negative. CT scan:  Cholelithiasis. A single gallstone is noted in the region of the gallbladder neck superiorly. No complicating factors are seen.  ROS: Review of Systems  Constitutional: Negative.   HENT: Negative.    Eyes: Negative.   Respiratory: Negative.         Has not had any COVID vaccines.  Cardiovascular: Negative.   Gastrointestinal:  Positive for abdominal pain, heartburn (Occasionally but sounds rare.), nausea and vomiting. Negative for blood in stool, constipation, diarrhea and melena.  Genitourinary: Negative.   Musculoskeletal: Negative.   Skin: Negative.   Neurological: Negative.   Endo/Heme/Allergies: Negative.   Psychiatric/Behavioral: Negative.     Family History  Adopted: Yes    Past Medical History:  Diagnosis Date   Breast cancer ([facility] 09/13/2016   left breast    DVT (deep venous thrombosis) ([facility] 06/2016   Left calf   Factor 5 Leiden mutation, heterozygous ([facility]    Hypercholesteremia    Personal history of radiation therapy 2018    Past Surgical History:  Procedure Laterality Date   BREAST LUMPECTOMY Left 10/2016   BREAST LUMPECTOMY WITH RADIOACTIVE SEED LOCALIZATION Left 10/10/2016   Procedure: LEFT BREAST  LUMPECTOMY WITH RADIOACTIVE SEED LOCALIZATION;  Surgeon: MDonnie Mesa MD;  [facility] [facility]  Service: General;  Laterality: Left;   DILATION AND CURETTAGE OF UTERUS  1998   FRACTURE SURGERY Left    foot at age 60   Social History:  reports that she has never smoked. She has never used smokeless tobacco. She reports current alcohol use. She reports that she does not use drugs. EtOH: Social 1-3 drinks per night 3 times a week. Drugs: None Tobacco: None She works as a fRisk manager  Married with 2 adult children.  Allergies: No Known Allergies  Medications Prior to Admission  Medication Sig Dispense Refill   b complex vitamins tablet Take 1 tablet by mouth daily.     bimatoprost (LATISSE) 0.03 % ophthalmic solution 1 APPLICATION TO AFFECTED AREA ONCE A DAY EXTERNALLY(COSMETIC, INS WILL NOT COVER)     Calcium Carbonate-Vit D-Min (CALCIUM 1200 PO) Take by mouth 2 (two) times daily.      COLLAGEN PO Take by mouth daily. Collagen Peptides 10 grams Type 1 & 3     ibuprofen (ADVIL,MOTRIN) 200 MG tablet Take 400 mg by mouth every 4 (four) hours as needed.     Magnesium 250 MG TABS Take 1 tablet by mouth daily.     Misc Natural Products (LUTEIN 20 PO) Take 1 tablet by mouth daily.     Multiple Vitamin (MULTIVITAMIN) capsule Take 1 capsule by mouth daily.     OVER THE COUNTER MEDICATION Take 2 capsules by mouth daily in the afternoon. DHadar  Vit A, D3, & K2     OVER THE COUNTER MEDICATION Take 4 tablets by mouth 2 (two) times daily. Lipothiamine     OVER THE COUNTER MEDICATION Take 2 capsules by mouth daily in the afternoon. EGCG SAP, Ultra Antioxidant Support     OVER THE COUNTER MEDICATION Take 1 Scoop by mouth daily in the afternoon. Multi-Element Buffered C, Vegetarian Dietary supplement     Red Yeast Rice Extract (RED YEAST RICE PO) Take 1,200 mg by mouth daily.     Turmeric 500 MG TABS Take 500 mg by mouth 2 (two) times daily.     vitamin C (ASCORBIC  ACID) 500 MG tablet Take 500 mg by mouth daily as needed.      Zinc 50 MG CAPS Take 1 capsule by mouth daily.      Blood pressure 129/69, pulse 64, temperature 98.1 F (36.7 C), temperature source Oral, resp. rate 18, height '5\' 7"'$  (1.702 m), weight 59.9 kg, SpO2 100 %. Physical Exam:  General: pleasant, WD, WN white female who is laying in bed in NAD HEENT: head is normocephalic, atraumatic.  Sclera are noninjected.  Pupils are equal ears and nose without any masses or lesions.  Mouth is pink and moist Heart: regular, rate, and rhythm.  Normal s1,s2. No obvious murmurs, gallops, or rubs noted.  Palpable radial and pedal pulses bilaterally Lungs: CTAB, no wheezes, rhonchi, or rales noted.  Respiratory effort nonlabored Abd: soft, tender in the right upper quadrant with a discomfort going to her back.  ND, +BS, no masses, hernias, or organomegaly MS: all 4 extremities are symmetrical with no cyanosis, clubbing, or edema. Skin: warm and dry with no masses, lesions, or rashes Neuro: Cranial nerves 2-12 grossly intact, sensation is normal throughout Psych: A&Ox3 with an appropriate affect.   Results for orders placed or performed during the hospital encounter of 04/12/21 (from the past 48 hour(s))  CBC with Differential/Platelet     Status: Abnormal   Collection Time: 04/12/21  1:28 AM  Result Value Ref Range   WBC 9.7 4.0 - 10.5 K/uL   RBC 4.14 3.87 - 5.11 MIL/uL   Hemoglobin 13.0 12.0 - 15.0 g/dL   HCT 39.1 36.0 - 46.0 %   MCV 94.4 80.0 - 100.0 fL   MCH 31.4 26.0 - 34.0 pg   MCHC 33.2 30.0 - 36.0 g/dL   RDW 12.7 11.5 - 15.5 %   Platelets 176 150 - 400 K/uL   nRBC 0.0 0.0 - 0.2 %   Neutrophils Relative % 83 %   Neutro Abs 8.1 (H) 1.7 - 7.7 K/uL   Lymphocytes Relative 11 %   Lymphs Abs 1.1 0.7 - 4.0 K/uL   Monocytes Relative 5 %   Monocytes Absolute 0.5 0.1 - 1.0 K/uL   Eosinophils Relative 0 %   Eosinophils Absolute 0.0 0.0 - 0.5 K/uL   Basophils Relative 1 %   Basophils Absolute  0.1 0.0 - 0.1 K/uL   Immature Granulocytes 0 %   Abs Immature Granulocytes 0.02 0.00 - 0.07 K/uL    Comment: Performed at [facility], [place], [place] [address]  Comprehensive metabolic panel     Status: Abnormal   Collection Time: 04/12/21  1:28 AM  Result Value Ref Range   Sodium 134 (L) 135 - 145 mmol/L   Potassium 3.7 3.5 - 5.1 mmol/L   Chloride 98 98 - 111 mmol/L   CO2 24 22 - 32 mmol/L   Glucose, Bld  122 (H) 70 - 99 mg/dL    Comment: Glucose reference range applies only to samples taken after fasting for at least 8 hours.   BUN 16 6 - 20 mg/dL   Creatinine, Ser 0.68 0.44 - 1.00 mg/dL   Calcium 9.4 8.9 - 10.3 mg/dL   Total Protein 7.8 6.5 - 8.1 g/dL   Albumin 4.4 3.5 - 5.0 g/dL   AST 21 15 - 41 U/L   ALT 13 0 - 44 U/L   Alkaline Phosphatase 56 38 - 126 U/L   Total Bilirubin 0.6 0.3 - 1.2 mg/dL   GFR, Estimated >60 >60 mL/min    Comment: (NOTE) Calculated using the CKD-EPI Creatinine Equation (2021)    Anion gap 12 5 - 15    Comment: Performed at [facility], [place], [place] [address]  Lipase, blood     Status: None   Collection Time: 04/12/21  1:28 AM  Result Value Ref Range   Lipase 30 11 - 51 U/L    Comment: Performed at [facility], [address], [place], [place] [address]  Urinalysis, Routine w reflex microscopic     Status: Abnormal   Collection Time: 04/12/21  1:28 AM  Result Value Ref Range   Color, Urine YELLOW YELLOW   APPearance CLEAR CLEAR   Specific Gravity, Urine 1.026 1.005 - 1.030   pH 6.5 5.0 - 8.0   Glucose, UA NEGATIVE NEGATIVE mg/dL   Hgb urine dipstick NEGATIVE NEGATIVE   Bilirubin Urine NEGATIVE NEGATIVE   Ketones, ur 15 (A) NEGATIVE mg/dL   Protein, ur TRACE (A) NEGATIVE mg/dL   Nitrite NEGATIVE NEGATIVE   Leukocytes,Ua TRACE (A) NEGATIVE   RBC / HPF 0-5 0 - 5 RBC/hpf   WBC, UA 0-5 0 - 5 WBC/hpf   Squamous Epithelial / LPF 0-5 0 - 5   Mucus  PRESENT     Comment: Performed at [facility], [address] [place], [place] [address]  Resp Panel by RT-PCR (Flu A&B, Covid) Nasopharyngeal Swab     Status: None   Collection Time: 04/12/21  3:01 AM   Specimen: Nasopharyngeal Swab; Nasopharyngeal(NP) swabs in vial transport medium  Result Value Ref Range   SARS Coronavirus 2 by RT PCR NEGATIVE NEGATIVE    Comment: (NOTE) SARS-CoV-2 target nucleic acids are NOT DETECTED.  The SARS-CoV-2 RNA is generally detectable in upper respiratory specimens during the acute phase of infection. The lowest concentration of SARS-CoV-2 viral copies this assay can detect is 138 copies/mL. A negative result does not preclude SARS-Cov-2 infection and should not be used as the sole basis for treatment or other patient management decisions. A negative result may occur with  improper specimen collection/handling, submission of specimen other than nasopharyngeal swab, presence of viral mutation(s) within the areas targeted by this assay, and inadequate number of viral copies(<138 copies/mL). A negative result must be combined with clinical observations, patient history, and epidemiological information. The expected result is Negative.  Fact Sheet for Patients:  EntrepreneurPulse.com.au  Fact Sheet for Healthcare Providers:  IncredibleEmployment.be  This test is no t yet approved or cleared by the [place] FDA and  has been authorized for detection and/or diagnosis of SARS-CoV-2 by FDA under an Emergency Use Authorization (EUA). This EUA will remain  in effect (meaning this test can be used) for the duration of the COVID-19 declaration under Section 564(b)(1) of the Act, 21 U.S.C.section 360bbb-3(b)(1), unless the authorization is terminated  or revoked sooner.  Influenza A by PCR NEGATIVE NEGATIVE   Influenza B by PCR NEGATIVE NEGATIVE    Comment: (NOTE) The Xpert Xpress  SARS-CoV-2/FLU/RSV plus assay is intended as an aid in the diagnosis of influenza from Nasopharyngeal swab specimens and should not be used as a sole basis for treatment. Nasal washings and aspirates are unacceptable for Xpert Xpress SARS-CoV-2/FLU/RSV testing.  Fact Sheet for Patients: EntrepreneurPulse.com.au  Fact Sheet for Healthcare Providers: IncredibleEmployment.be  This test is not yet approved or cleared by the [place] FDA and has been authorized for detection and/or diagnosis of SARS-CoV-2 by FDA under an Emergency Use Authorization (EUA). This EUA will remain in effect (meaning this test can be used) for the duration of the COVID-19 declaration under Section 564(b)(1) of the Act, 21 U.S.C. section 360bbb-3(b)(1), unless the authorization is terminated or revoked.  Performed at [facility], [address] [place], [place] [address]    CT ABDOMEN PELVIS W CONTRAST  Result Date: 04/12/2021 CLINICAL DATA:  Epigastric pain for several hours EXAM: CT ABDOMEN AND PELVIS WITH CONTRAST TECHNIQUE: Multidetector CT imaging of the abdomen and pelvis was performed using the standard protocol following bolus administration of intravenous contrast. CONTRAST:  21m OMNIPAQUE IOHEXOL 350 MG/ML SOLN COMPARISON:  None. FINDINGS: Lower chest: No acute abnormality. Hepatobiliary: Gallbladder is well distended with multiple gallstones within. A single gallstone is noted near the region of the gallbladder neck. No wall thickening or pericholecystic fluid is noted. The liver is within normal limits. No common bile duct stone is seen. Pancreas: Unremarkable. No pancreatic ductal dilatation or surrounding inflammatory changes. Spleen: Normal in size without focal abnormality. Adrenals/Urinary Tract: Adrenal glands are within normal limits. Kidneys are well visualized bilaterally. No renal calculi or obstructive changes are seen. The bladder is  partially distended. Stomach/Bowel: No obstructive or inflammatory changes of the colon are seen. The appendix is well visualized and within normal limits. Small bowel and stomach are unremarkable. Vascular/Lymphatic: No significant vascular findings are present. No enlarged abdominal or pelvic lymph nodes. Reproductive: Uterus and bilateral adnexa are unremarkable. Other: No abdominal wall hernia or abnormality. No abdominopelvic ascites. Musculoskeletal: No acute or significant osseous findings. IMPRESSION: Cholelithiasis. A single gallstone is noted in the region of the gallbladder neck superiorly. No complicating factors are seen. Ultrasound may be helpful for further evaluation. No other focal abnormality is noted. Electronically Signed   By: MInez CatalinaM.D.   On: 04/12/2021 02:40      Assessment/Plan Cholelithiasis with acute cholecystitis.  FEN: N.p.o./IV fluid ID: Rocephin DVT: SCDs  Hx left breast cancer with mastectomy/radiation therapy Factor V Leyden mutation Hx DVT 2017-not currently on anticoagulation Hypercholesterolemia  Plan: Admit for laparoscopic cholecystectomy with possible intraoperative cholangiogram later today if possible. IV fluids, n.p.o., IV Rocephin.  Risk and benefits were discussed with patient detail informed after consent was obtained.   JEarnstine RegalPAurora Baycare Med CtrSurgery 04/12/2021, 9:38 AM Please see Amion for pager number during day hours 7:00am-4:30pm

## 2021-04-12 NOTE — ED Provider Notes (Signed)
[facility] EMERGENCY DEPT Provider Note  CSN: [id number] Arrival date & time: 04/12/21 0054  Chief Complaint(s) Abdominal Pain  HPI Alyssa Riley is a 60 y.o. female with a past medical history listed below who presents to the emergency department with several hours of abdominal pain that began around 9 PM.  Pain radiating to the back. Pain worse with movement.  No alleviating factors. Had an episode of nonbloody nonbilious emesis.  Several bouts of normal bowel movements without diarrhea.  No recent fevers or infections.  No coughing or congestion.  No known sick contacts.  No suspicious food intake.    The history is provided by the patient.   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 Authorizing Provider  b complex vitamins tablet Take 1 tablet by mouth daily.    [provider]  bimatoprost (LATISSE) 0.03 % ophthalmic solution 1 APPLICATION TO AFFECTED AREA ONCE A DAY EXTERNALLY(COSMETIC, INS WILL NOT COVER) 09/03/18   [provider]  Calcium Carbonate-Vit D-Min (CALCIUM 1200 PO) Take by mouth 2 (two) times daily.     [provider]  COLLAGEN PO Take by mouth daily. Collagen Peptides 10 grams Type 1 & 3    [provider]  ibuprofen (ADVIL,MOTRIN) 200 MG tablet Take 400 mg by mouth every 4 (four) hours as needed.    [provider]  Magnesium 250 MG TABS Take 1 tablet by mouth daily.    [provider]  Misc Natural Products (LUTEIN 20 PO) Take 1 tablet by mouth daily.    [provider]  Multiple Vitamin (MULTIVITAMIN) capsule Take 1 capsule by mouth daily.    [provider]  OVER THE COUNTER MEDICATION Take 2 capsules by mouth daily in the afternoon. [employer] Vit A, D3, & K2    [provider]  OVER THE COUNTER MEDICATION Take 4 tablets by mouth 2 (two) times daily. Lipothiamine    [provider]  OVER THE COUNTER MEDICATION Take 2 capsules by mouth daily in the afternoon. EGCG SAP, Ultra Antioxidant Support    [provider]  OVER THE COUNTER MEDICATION Take 1 Scoop by mouth daily in the afternoon. Multi-Element Buffered C, Vegetarian Dietary supplement    [provider]  Red Yeast Rice Extract (RED YEAST RICE PO) Take 1,200 mg by mouth daily.    [provider]  Turmeric 500 MG TABS Take 500 mg by mouth 2 (two) times daily.    [provider]  vitamin C (ASCORBIC ACID) 500 MG tablet Take 500 mg by mouth daily as needed.     [provider]  Zinc 50 MG CAPS Take 1 capsule by mouth daily.    [provider]  Past Surgical History Past Surgical History:  Procedure Laterality Date   BREAST LUMPECTOMY Left 10/2016   BREAST LUMPECTOMY WITH RADIOACTIVE SEED LOCALIZATION Left 10/10/2016   Procedure: LEFT BREAST LUMPECTOMY WITH RADIOACTIVE SEED LOCALIZATION;  Surgeon: Donnie Mesa, MD;  Location: [facility];  Service: General;  Laterality: Left;   DILATION AND CURETTAGE OF UTERUS  1998   FRACTURE SURGERY Left    foot at age 31   Family History Family History  Adopted: Yes    Social History Social History   Tobacco Use   Smoking status: Never    Smokeless tobacco: Never  Vaping Use   Vaping Use: Never used  Substance Use Topics   Alcohol use: Yes    Comment: social    Drug use: No   Allergies Patient has no known allergies.  Review of Systems Review of Systems All other systems are reviewed and are negative for acute change except as noted in the HPI  Physical Exam Vital Signs  I have reviewed the triage vital signs BP (!) 121/96   Pulse (!) 57   Temp 97.8 F (36.6 C) (Oral)   Resp 17   Ht '5\' 7"'$  (1.702 m)   Wt 59.9 kg   SpO2 100%   BMI 20.67 kg/m   Physical Exam Vitals reviewed.  Constitutional:      General: She is not in acute distress.    Appearance: She is well-developed. She is not diaphoretic.  HENT:     Head: Normocephalic and atraumatic.     Right Ear: External ear normal.     Left Ear: External ear normal.     Nose: Nose normal.  Eyes:     General: No scleral icterus.    Conjunctiva/sclera: Conjunctivae normal.  Neck:     Trachea: Phonation normal.  Cardiovascular:     Rate and Rhythm: Normal rate and regular rhythm.  Pulmonary:     Effort: Pulmonary effort is normal. No respiratory distress.     Breath sounds: No stridor.  Abdominal:     General: There is no distension.     Tenderness: There is abdominal tenderness in the right upper quadrant and epigastric area. There is guarding and rebound. Positive signs include Murphy's sign. Negative signs include McBurney's sign.     Hernia: No hernia is present.  Musculoskeletal:        General: Normal range of motion.     Cervical back: Normal range of motion.  Neurological:     Mental Status: She is alert and oriented to person, place, and time.  Psychiatric:        Behavior: Behavior normal.    ED Results and Treatments Labs (all labs ordered are listed, but only abnormal results are displayed) Labs Reviewed  CBC WITH DIFFERENTIAL/PLATELET - Abnormal; Notable for the following components:      Result Value   Neutro Abs 8.1 (*)    All  other components within normal limits  COMPREHENSIVE METABOLIC PANEL - Abnormal; Notable for the following components:   Sodium 134 (*)    Glucose, Bld 122 (*)    All other components within normal limits  URINALYSIS, ROUTINE W REFLEX MICROSCOPIC - Abnormal; Notable for the following components:   Ketones, ur 15 (*)    Protein, ur TRACE (*)    Leukocytes,Ua TRACE (*)    All other components within normal limits  RESP PANEL BY RT-PCR (FLU A&B, COVID) ARPGX2  LIPASE, BLOOD  EKG  EKG Interpretation  Date/Time:    Ventricular Rate:    PR Interval:    QRS Duration:   QT Interval:    QTC Calculation:   R Axis:     Text Interpretation:         Radiology CT ABDOMEN PELVIS W CONTRAST  Result Date: 04/12/2021 CLINICAL DATA:  Epigastric pain for several hours EXAM: CT ABDOMEN AND PELVIS WITH CONTRAST TECHNIQUE: Multidetector CT imaging of the abdomen and pelvis was performed using the standard protocol following bolus administration of intravenous contrast. CONTRAST:  1m OMNIPAQUE IOHEXOL 350 MG/ML SOLN COMPARISON:  None. FINDINGS: Lower chest: No acute abnormality. Hepatobiliary: Gallbladder is well distended with multiple gallstones within. A single gallstone is noted near the region of the gallbladder neck. No wall thickening or pericholecystic fluid is noted. The liver is within normal limits. No common bile duct stone is seen. Pancreas: Unremarkable. No pancreatic ductal dilatation or surrounding inflammatory changes. Spleen: Normal in size without focal abnormality. Adrenals/Urinary Tract: Adrenal glands are within normal limits. Kidneys are well visualized bilaterally. No renal calculi or obstructive changes are seen. The bladder is partially distended. Stomach/Bowel: No obstructive or inflammatory changes of the colon are seen. The appendix is well visualized and within  normal limits. Small bowel and stomach are unremarkable. Vascular/Lymphatic: No significant vascular findings are present. No enlarged abdominal or pelvic lymph nodes. Reproductive: Uterus and bilateral adnexa are unremarkable. Other: No abdominal wall hernia or abnormality. No abdominopelvic ascites. Musculoskeletal: No acute or significant osseous findings. IMPRESSION: Cholelithiasis. A single gallstone is noted in the region of the gallbladder neck superiorly. No complicating factors are seen. Ultrasound may be helpful for further evaluation. No other focal abnormality is noted. Electronically Signed   By: MInez CatalinaM.D.   On: 04/12/2021 02:40    Pertinent labs & imaging results that were available during my care of the patient were reviewed by me and considered in my medical decision making (see MDM for details).  Medications Ordered in ED Medications  HYDROmorphone (DILAUDID) injection 0.5 mg (has no administration in time range)  sodium chloride 0.9 % bolus 1,000 mL (1,000 mLs Intravenous New Bag/Given 04/12/21 0140)  ondansetron (ZOFRAN) injection 4 mg (4 mg Intravenous Given 04/12/21 0138)  morphine 4 MG/ML injection 4 mg (4 mg Intravenous Given 04/12/21 0138)  iohexol (OMNIPAQUE) 350 MG/ML injection 100 mL (60 mLs Intravenous Contrast Given 04/12/21 0217)                                                                                                                                     Procedures Ultrasound ED Abd  Date/Time: 04/12/2021 3:03 AM Performed by: CFatima Blank MD Authorized by: CFatima Blank MD   Procedure details:    Indications: abdominal pain     Assessment for:  Gallstones   Hepatobiliary:  Visualized    Images: archived    Hepatobiliary findings:  Common bile duct:  Abnormal   Gallbladder wall:  Normal   Gallbladder stones: identified     Intra-abdominal fluid: not identified     Sonographic Murphy's sign: positive    (including critical care  time)  Medical Decision Making / ED Course I have reviewed the nursing notes for this encounter and the patient's prior records (if available in EHR or on provided paperwork).  Alyssa Riley was evaluated in Emergency Department on 04/12/2021 for the symptoms described in the history of present illness. She was evaluated in the context of the global COVID-19 pandemic, which necessitated consideration that the patient might be at risk for infection with the SARS-CoV-2 virus that causes COVID-19. Institutional protocols and algorithms that pertain to the evaluation of patients at risk for COVID-19 are in a state of rapid change based on information released by regulatory bodies including the CDC and federal and state organizations. These policies and algorithms were followed during the patient's care in the ED.     Epigastric and right upper quadrant abdominal pain. Tender to palpation with rebound and positive Murphy sign. Most concerning for biliary disease. Bedside ultrasound notable for numerous gallstones with what appears to be a stone in the neck or in the common bile duct with a dilated duct.  Positive Murphy sign. No formal ultrasound available at this time the night.  Patient provided with IV fluids, antiemetics and pain medicine. Screening labs obtained.  Pertinent labs & imaging results that were available during my care of the patient were reviewed by me and considered in my medical decision making:  CBC without leukocytosis but does have a left shift.  White count is up twice her normal. Metabolic panel without significant electrolyte derangements or renal sufficiency.  No evidence of bili obstruction or pancreatitis. UA without evidence of infection.  CT obtained to rule out other process and negative.  It was notable for the cholelithiasis with a stone stuck in the gallbladder neck.  Patient will require admission for symptomatic cholelithiasis. Consult to general  surgery.  Final Clinical Impression(s) / ED Diagnoses Final diagnoses:  Symptomatic cholelithiasis  Calculus of gallbladder with biliary obstruction but without cholecystitis     This chart was dictated using voice recognition software.  Despite best efforts to proofread,  errors can occur which can change the documentation meaning.    Fatima Blank, MD 04/12/21 [phone]

## 2021-04-12 NOTE — Anesthesia Procedure Notes (Signed)
Procedure Name: Intubation Date/Time: 04/12/2021 3:42 PM Performed by: Carolan Clines, CRNA Pre-anesthesia Checklist: Patient identified, Emergency Drugs available, Suction available and Patient being monitored Patient Re-evaluated:Patient Re-evaluated prior to induction Oxygen Delivery Method: Circle System Utilized Preoxygenation: Pre-oxygenation with 100% oxygen Induction Type: IV induction Ventilation: Mask ventilation without difficulty Laryngoscope Size: Mac and 3 Grade View: Grade I Tube type: Oral Tube size: 7.0 mm Number of attempts: 1 Airway Equipment and Method: Stylet and Oral airway Placement Confirmation: ETT inserted through vocal cords under direct vision, positive ETCO2 and breath sounds checked- equal and bilateral Secured at: 22 cm Tube secured with: Tape Dental Injury: Teeth and Oropharynx as per pre-operative assessment

## 2021-04-12 NOTE — Discharge Instructions (Signed)
[facility] LAPAROSCOPIC SURGERY: POST OP INSTRUCTIONS  Always review your discharge instruction sheet given to you by the facility where your surgery was performed. IF YOU HAVE DISABILITY OR FAMILY LEAVE FORMS, YOU MUST BRING THEM TO THE OFFICE FOR PROCESSING.   DO NOT GIVE THEM TO YOUR DOCTOR.  A prescription for pain medication may be given to you upon discharge.  Take your pain medication as prescribed, if needed.  If narcotic pain medicine is not needed, then you may take acetaminophen (Tylenol), naprosyn (Alleve), or ibuprofen (Advil) as needed. Take your usually prescribed medications unless otherwise directed. If you need a refill on your pain medication, please contact your pharmacy.  They will contact our office to request authorization. Prescriptions will not be filled after 5pm or on week-ends. You should follow a light diet the first few days after arrival home, such as soup and crackers, etc.  Be sure to include lots of fluids daily. Most patients will experience some swelling and bruising in the area of the incisions.  Ice packs will help.  Swelling and bruising can take several days to resolve.  It is common to experience some constipation if taking pain medication after surgery.  Increasing fluid intake and taking a stool softener (such as Colace) will usually help or prevent this problem from occurring.  A mild laxative (Milk of Magnesia or Miralax) should be taken according to package instructions if there are no bowel movements after 48 hours. Unless discharge instructions indicate otherwise, you may remove your bandages 48 hours after surgery, and you may shower at that time.  You may have steri-strips (small skin tapes) in place directly over the incision.  These strips should be left on the skin for 7-10 days.  If your surgeon used skin glue on the incision, you may shower in 24 hours.  The glue will flake off over the next  2-3 weeks.  Any sutures or staples will be removed at the office during your follow-up visit. ACTIVITIES:  You may resume regular (light) daily activities beginning the next day--such as daily self-care, walking, climbing stairs--gradually increasing activities as tolerated.  You may have sexual intercourse when it is comfortable.  Refrain from any heavy lifting or straining until approved by your doctor. You may drive when you are no longer taking prescription pain medication, you can comfortably wear a seatbelt, and you can safely maneuver your car and apply brakes. RETURN TO WORK:  __________________________________________________________ You should see your doctor in the office for a follow-up appointment approximately 2-3 weeks after your surgery.  Make sure that you call for this appointment within a day or two after you arrive home to insure a convenient appointment time. OTHER INSTRUCTIONS: __________________________________________________________________________________________________________________________ __________________________________________________________________________________________________________________________ WHEN TO CALL YOUR DOCTOR: Fever over 101.0 Inability to urinate Continued bleeding from incision. Increased pain, redness, or drainage from the incision. Increasing abdominal pain  The clinic staff is available to answer your questions during regular business hours.  Please don't hesitate to call and ask to speak to one of the nurses for clinical concerns.  If you have a medical emergency, go to the nearest emergency room or call 911.  A surgeon from [facility] is always on call at the hospital. [address], [address], [place], [place]  [address] ? [address] [address], [place], [place]   [address] [phone] ? [phone] ? FAX [phone] Web site: [email or web address]  

## 2021-04-12 NOTE — Op Note (Signed)
Preoperative diagnosis: Acute calculous cholecystitis Postoperative diagnosis: saa Procedure: Laparoscopic cholecystectomy Surgeon. Dr Serita Grammes Asst: Richard Miu PA-C Anesthesia: general Complications none EBL: minimal Specimens gb and contents to pathology Drains: None Sponge and count was correct completion Decision to recovery stable condition   Indications: 72 yof with acute calculous cholecystitis.  I discussed lap chole with her.     Procedure: After informed consent was obtained the patient was taken to the OR.  She was given antibiotics.  SCDs were in place.  She was placed under general anesthesia without complication.  she was given 5000 units sq heparin due to factor V Leiden mutation  She was prepped and draped in the standard sterile surgical fashion.  Surgical timeout was then performed.   I infiltrated Marcaine and made an vertical incision below the umbilicus. I then entered the fascia sharply and the peritoneum bluntly.  I placed a 0 vicryl pursestring suture and inserted a hasson trocar. I insufflated the abdomen to 15 mm Hg pressure.   I then inserted 3 additional 5 mm trocars in the epigastrium and right upper quadrant under direct vision without complication. The gallbladder had acute cholecystitis and was tense. I had to drain the gallbladder to grasp it. There was omentum adherent to it.  I was able to lyse these adhesions and eventually retracted it cephalad and lateral.   Eventually I was able to dissect the triangle and clearly obtain the critical view of safety. I then clipped the artery 3 times and divided it leaving a clip in place.  I used the ICG dye and was able to see the common duct.  I clipped the cystic duct three times and divided it leaving two in place. The duct was viable and the clips traversed the duct.  I then removed the gallbladder from the liver bed completely. I then placed the gallbladder in a retrieval bag. This was removed from the umbilical  port site later.  I obtained hemostasis. I did place a piece of surgicel snow in the fossa. I had irrigated until this was clear.  I then closed the umbilical trocar site with an additional 0 vicryl suture with the suture passer device.    The abdomen was then desufflated and the remaining trocars removed.  I then closed these with 4-0 Monocryl.  Glue and Steri-Strips were applied.  She tolerated this well was extubated and transferred to recovery stable.

## 2021-04-12 NOTE — Transfer of Care (Signed)
Immediate Anesthesia Transfer of Care Note  Patient: Alyssa Riley  Procedure(s) Performed: LAPAROSCOPIC CHOLECYSTECTOMY  Patient Location: PACU  Anesthesia Type:General  Level of Consciousness: drowsy  Airway & Oxygen Therapy: Patient Spontanous Breathing  Post-op Assessment: Report given to RN and Post -op Vital signs reviewed and stable  Post vital signs: Reviewed and stable  Last Vitals:  Vitals Value Taken Time  BP 126/65 04/12/21 1654  Temp    Pulse 77 04/12/21 1655  Resp 10 04/12/21 1655  SpO2 98 % 04/12/21 1655  Vitals shown include unvalidated device data.  Last Pain:  Vitals:   04/12/21 1345  TempSrc: Oral  PainSc:       Patients Stated Pain Goal: 0 ([phone])  Complications: No notable events documented.

## 2021-04-13 ENCOUNTER — Other Ambulatory Visit (HOSPITAL_COMMUNITY): Payer: Self-pay

## 2021-04-13 ENCOUNTER — Encounter (HOSPITAL_COMMUNITY): Payer: Self-pay | Admitting: General Surgery

## 2021-04-13 MED ORDER — DOCUSATE SODIUM 100 MG PO CAPS: 100.0000 mg | ORAL_CAPSULE | Freq: Two times a day (BID) | ORAL | 0 refills | Status: AC

## 2021-04-13 MED ORDER — OXYCODONE HCL 5 MG PO TABS
5.0000 mg | ORAL_TABLET | Freq: Four times a day (QID) | ORAL | 0 refills | Status: AC | PRN
  Filled 2021-04-13: qty 20, 5d supply, fill #0

## 2021-04-13 MED ORDER — POLYETHYLENE GLYCOL 3350 17 G PO PACK: 17.0000 g | PACK | Freq: Every day | ORAL | 0 refills | Status: AC | PRN

## 2021-04-13 NOTE — Progress Notes (Signed)
Pt ambulated in hallway x2.

## 2021-04-13 NOTE — Discharge Summary (Signed)
[facility] Discharge Summary   Patient ID: Alyssa Riley SESSION MRN: [id number] DOB/AGE: [birth year] 60 y.o.  Admit date: 04/12/2021 Discharge date: 04/13/2021  Admitting Diagnosis:  Symptomatic cholelithiasis 04/12/2021   Cholelithiasis with acute cholecystitis 04/12/2021   Factor V Leiden ([place]) 10/16/2017   History of DVT (deep vein thrombosis) 10/16/2017   Discharge Diagnosis Patient Active Problem List   Diagnosis Date Noted   Symptomatic cholelithiasis 04/12/2021   Cholelithiasis with acute cholecystitis 04/12/2021   Factor V Leiden ([place]) 10/16/2017   History of DVT (deep vein thrombosis) 10/16/2017    Imaging: CT ABDOMEN PELVIS W CONTRAST  Result Date: 04/12/2021 CLINICAL DATA:  Epigastric pain for several hours EXAM: CT ABDOMEN AND PELVIS WITH CONTRAST TECHNIQUE: Multidetector CT imaging of the abdomen and pelvis was performed using the standard protocol following bolus administration of intravenous contrast. CONTRAST:  21m OMNIPAQUE IOHEXOL 350 MG/ML SOLN COMPARISON:  None. FINDINGS: Lower chest: No acute abnormality. Hepatobiliary: Gallbladder is well distended with multiple gallstones within. A single gallstone is noted near the region of the gallbladder neck. No wall thickening or pericholecystic fluid is noted. The liver is within normal limits. No common bile duct stone is seen. Pancreas: Unremarkable. No pancreatic ductal dilatation or surrounding inflammatory changes. Spleen: Normal in size without focal abnormality. Adrenals/Urinary Tract: Adrenal glands are within normal limits. Kidneys are well visualized bilaterally. No renal calculi or obstructive changes are seen. The bladder is partially distended. Stomach/Bowel: No obstructive or inflammatory changes of the colon are seen. The appendix is well visualized and within normal limits. Small bowel and stomach are unremarkable. Vascular/Lymphatic: No significant vascular findings are present. No enlarged abdominal or  pelvic lymph nodes. Reproductive: Uterus and bilateral adnexa are unremarkable. Other: No abdominal wall hernia or abnormality. No abdominopelvic ascites. Musculoskeletal: No acute or significant osseous findings. IMPRESSION: Cholelithiasis. A single gallstone is noted in the region of the gallbladder neck superiorly. No complicating factors are seen. Ultrasound may be helpful for further evaluation. No other focal abnormality is noted. Electronically Signed   By: MInez CatalinaM.D.   On: 04/12/2021 02:40    Procedures Dr. MRolm Bookbinder(04/12/21) - Laparoscopic Cholecystectomy    Hospital Course:  [facility] complaining of abdominal pain that goes to her back.  Pain started around 1700 hrs night prior, while driving home from work.  Pain had increased with one episode of emesis.     PMH includes left breast cancer with lumpectomy  2018, and radiation Rx, Factor 5 Leiden mutation DVT 2017, hypercholesteremia.   Work up showed she was afebrile, BP up on admission, VSS. WBC 9.772m/H 13/39, platelets 176K, respiratory panel negative. CT scan:  Cholelithiasis. A single gallstone is noted in the region of the gallbladder neck superiorly. No complicating factors are seen.  Patient was admitted and underwent procedure listed above.  Tolerated procedure well and was transferred to the floor.  Diet was advanced as tolerated.  On POD1, the patient was voiding well, tolerating diet, ambulating well, pain well controlled, vital signs stable, incisions c/d/i and felt stable for discharge home.  Patient will follow up in our office in 3 weeks and knows to call with questions or concerns.   She was discharged with oxycodone '5mg'$  #20/0 for pain control.  Physical Exam: General:  Alert, NAD, pleasant, comfortable Abd:  Soft, ND, mild tenderness, incisions C/D/I  I or a member of my team have reviewed this patient in the Controlled Substance Database.  Allergies as of  04/13/2021   No Known Allergies      Medication List     TAKE these medications    CALCIUM 1200 PO Take 1 tablet by mouth 2 (two) times daily.   diphenhydramine-acetaminophen 25-500 MG Tabs tablet Commonly known as: TYLENOL PM Take 1 tablet by mouth at bedtime as needed (sleep/pain).   docusate sodium 100 MG capsule Commonly known as: Colace Take 1 capsule (100 mg total) by mouth 2 (two) times daily for 3 days.   ibuprofen 200 MG tablet Commonly known as: ADVIL Take 400 mg by mouth every 4 (four) hours as needed for fever, headache or mild pain.   LUTEIN PO Take 1 tablet by mouth daily.   Magnesium 250 MG Tabs Take 250 mg by mouth daily.   multivitamin capsule Take 1 capsule by mouth daily.   OVER THE COUNTER MEDICATION Take 2 capsules by mouth daily in the afternoon. [employer] Vit A, D3, & K2   OVER THE COUNTER MEDICATION Take 3 tablets by mouth daily. Juice Plus, three different gummies   oxyCODONE 5 MG immediate release tablet Commonly known as: Oxy IR/ROXICODONE Take 1 tablet (5 mg total) by mouth every 6 (six) hours as needed for up to 5 days for moderate pain or severe pain.   polyethylene glycol 17 g packet Commonly known as: MIRALAX / GLYCOLAX Take 17 g by mouth daily as needed for up to 3 days for moderate constipation or severe constipation.   RED YEAST RICE PO Take 1,200 mg by mouth daily.   Turmeric 500 MG Tabs Take 500 mg by mouth 2 (two) times daily.   vitamin C 500 MG tablet Commonly known as: ASCORBIC ACID Take 500 mg by mouth daily.          Follow-up Information     [facility]. Call on 05/05/2021.   Specialty: General Surgery Why: Please follow up on 05/05/21 at 1:30 pm to complete the check in process for a 2:00 pm appointment. Please bring insurance card and photo ID Contact information: [address] [place] [address] [phone]                 Signed: Winferd Humphrey ,  [facility] 04/13/2021, 9:24 AM Please see Amion for pager number during day hours 7:00am-4:30pm

## 2021-04-13 NOTE — Progress Notes (Signed)
[name] to be D/C'd  per MD order.  Discussed with the patient and all questions fully answered.  VSS, Skin clean, dry and intact without evidence of skin break down, no evidence of skin tears noted.  IV catheter discontinued intact. Site without signs and symptoms of complications. Dressing and pressure applied.  An After Visit Summary was printed and given to the patient. Patient received prescription.  D/c education completed with patient/family including follow up instructions, medication list, d/c activities limitations if indicated, with other d/c instructions as indicated by MD - patient able to verbalize understanding, all questions fully answered.   Patient instructed to return to ED, call 911, or call MD for any changes in condition.   Patient to be escorted via [place], and D/C home via private auto.

## 2021-04-13 NOTE — Plan of Care (Signed)

## 2021-04-14 LAB — SURGICAL PATHOLOGY

## 2021-07-21 ENCOUNTER — Encounter: Payer: Self-pay | Admitting: Physician Assistant

## 2021-07-21 NOTE — Telephone Encounter (Signed)
Please advise, are there any samples of Xarelto available for the Patient?

## 2021-08-24 ENCOUNTER — Other Ambulatory Visit: Payer: Self-pay | Admitting: Physician Assistant

## 2021-08-24 DIAGNOSIS — Z1231 Encounter for screening mammogram for malignant neoplasm of breast: Secondary | ICD-10-CM

## 2021-09-26 ENCOUNTER — Ambulatory Visit
Admission: RE | Admit: 2021-09-26 | Discharge: 2021-09-26 | Disposition: A | Discharge status: Home / Self Care | Payer: No Typology Code available for payment source | Source: Ambulatory Visit | Attending: Physician Assistant | Admitting: Physician Assistant

## 2021-09-26 DIAGNOSIS — Z1231 Encounter for screening mammogram for malignant neoplasm of breast: Secondary | ICD-10-CM

## 2021-10-21 ENCOUNTER — Encounter: Payer: Self-pay | Admitting: Physician Assistant

## 2022-05-22 LAB — HM COLONOSCOPY

## 2022-09-01 ENCOUNTER — Other Ambulatory Visit: Payer: Self-pay | Admitting: *Deleted

## 2022-09-01 DIAGNOSIS — Z1231 Encounter for screening mammogram for malignant neoplasm of breast: Secondary | ICD-10-CM

## 2022-10-23 ENCOUNTER — Ambulatory Visit
Admission: RE | Admit: 2022-10-23 | Discharge: 2022-10-23 | Disposition: A | Discharge status: Home / Self Care | Payer: No Typology Code available for payment source | Source: Ambulatory Visit | Attending: *Deleted | Admitting: *Deleted

## 2022-10-23 DIAGNOSIS — Z1231 Encounter for screening mammogram for malignant neoplasm of breast: Secondary | ICD-10-CM

## 2022-10-31 ENCOUNTER — Other Ambulatory Visit: Payer: Self-pay

## 2022-10-31 ENCOUNTER — Encounter (HOSPITAL_BASED_OUTPATIENT_CLINIC_OR_DEPARTMENT_OTHER): Payer: Self-pay

## 2022-10-31 DIAGNOSIS — Z86718 Personal history of other venous thrombosis and embolism: Secondary | ICD-10-CM | POA: Insufficient documentation

## 2022-10-31 DIAGNOSIS — R1013 Epigastric pain: Secondary | ICD-10-CM | POA: Insufficient documentation

## 2022-10-31 MED ORDER — OXYCODONE-ACETAMINOPHEN 5-325 MG PO TABS
1.0000 | ORAL_TABLET | ORAL | Status: DC | PRN
  Administered 2022-10-31: 1 via ORAL
  Filled 2022-10-31: qty 1

## 2022-10-31 NOTE — ED Triage Notes (Signed)
Pt reports pain is also similar to when she had giardia. Pt does report coming home from [place] 2 weeks ago.

## 2022-10-31 NOTE — ED Triage Notes (Signed)
Pt reports mid-abdominal pain that is been intermittent since yesterday morning. Pain was doing better in the morning and then started coming back after lunch and then worsening since. Pt reports pain is identical to the pain she had when she needed to have her gall bladder removed. Cholecystectomy happened in August of 2022. Pt denies N/V/D/fever.

## 2022-11-01 ENCOUNTER — Emergency Department (HOSPITAL_BASED_OUTPATIENT_CLINIC_OR_DEPARTMENT_OTHER): Payer: No Typology Code available for payment source

## 2022-11-01 ENCOUNTER — Emergency Department (HOSPITAL_BASED_OUTPATIENT_CLINIC_OR_DEPARTMENT_OTHER)
Admission: EM | Admit: 2022-11-01 | Discharge: 2022-11-01 | Disposition: A | Discharge status: Home / Self Care | Payer: No Typology Code available for payment source | Attending: Emergency Medicine | Admitting: Emergency Medicine

## 2022-11-01 DIAGNOSIS — R1013 Epigastric pain: Secondary | ICD-10-CM

## 2022-11-01 HISTORY — DX: Giardiasis (lambliasis): A07.1

## 2022-11-01 LAB — COMPREHENSIVE METABOLIC PANEL
ALT: 11 U/L (ref 0–44)
AST: 18 U/L (ref 15–41)
Albumin: 4.2 g/dL (ref 3.5–5.0)
Alkaline Phosphatase: 42 U/L (ref 38–126)
Anion gap: 10 (ref 5–15)
BUN: 16 mg/dL (ref 6–20)
CO2: 24 mmol/L (ref 22–32)
Calcium: 9.3 mg/dL (ref 8.9–10.3)
Chloride: 101 mmol/L (ref 98–111)
Creatinine, Ser: 0.73 mg/dL (ref 0.44–1.00)
GFR, Estimated: >60 mL/min (ref >60)
Glucose, Bld: 132 mg/dL — ABNORMAL HIGH (ref 70–99)
Potassium: 3.6 mmol/L (ref 3.5–5.1)
Sodium: 135 mmol/L (ref 135–145)
Total Bilirubin: 0.4 mg/dL (ref 0.3–1.2)
Total Protein: 7.1 g/dL (ref 6.5–8.1)

## 2022-11-01 LAB — CBC
HCT: 36.1 % (ref 36.0–46.0)
Hemoglobin: 12.1 g/dL (ref 12.0–15.0)
MCH: 31.3 pg (ref 26.0–34.0)
MCHC: 33.5 g/dL (ref 30.0–36.0)
MCV: 93.5 fL (ref 80.0–100.0)
Platelets: 218 10*3/uL (ref 150–400)
RBC: 3.86 MIL/uL — ABNORMAL LOW (ref 3.87–5.11)
RDW: 12.7 % (ref 11.5–15.5)
WBC: 9.9 10*3/uL (ref 4.0–10.5)
nRBC: 0 % (ref 0.0–0.2)

## 2022-11-01 LAB — URINALYSIS, ROUTINE W REFLEX MICROSCOPIC
Bilirubin Urine: NEGATIVE
Glucose, UA: NEGATIVE mg/dL
Hgb urine dipstick: NEGATIVE
Ketones, ur: NEGATIVE mg/dL
Leukocytes,Ua: NEGATIVE
Nitrite: NEGATIVE
Protein, ur: NEGATIVE mg/dL
Specific Gravity, Urine: 1.017 (ref 1.005–1.030)
pH: 6.5 (ref 5.0–8.0)

## 2022-11-01 LAB — LIPASE, BLOOD: Lipase: 36 U/L (ref 11–51)

## 2022-11-01 MED ORDER — IOHEXOL 300 MG/ML  SOLN
100.0000 mL | Freq: Once | INTRAMUSCULAR | Status: AC | PRN
  Administered 2022-11-01: 80 mL via INTRAVENOUS

## 2022-11-01 MED ORDER — OXYCODONE-ACETAMINOPHEN 5-325 MG PO TABS
2.0000 | ORAL_TABLET | Freq: Once | ORAL | Status: AC
  Administered 2022-11-01: 2 via ORAL
  Filled 2022-11-01: qty 2

## 2022-11-01 MED ORDER — OMEPRAZOLE 20 MG PO CPDR: 20.0000 mg | DELAYED_RELEASE_CAPSULE | Freq: Two times a day (BID) | ORAL | 0 refills | Status: DC

## 2022-11-01 MED ORDER — HYDROCODONE-ACETAMINOPHEN 5-325 MG PO TABS: 1.0000 | ORAL_TABLET | Freq: Four times a day (QID) | ORAL | 0 refills | Status: DC | PRN

## 2022-11-01 NOTE — ED Notes (Signed)
RN reviewed discharge instructions with pt. Pt verbalized understanding and had no further questions. VSS upon discharge.  

## 2022-11-01 NOTE — Discharge Instructions (Signed)
Begin taking Prilosec as prescribed.  Begin taking hydrocodone as prescribed as needed for pain not relieved with Prilosec.  Follow-up with primary doctor if not improving in the next week, and return to the ER if you develop severe abdominal pain, high fevers, bloody stools, or for other new and concerning symptoms.

## 2022-11-01 NOTE — ED Provider Notes (Signed)
[facility] Provider Note   CSN: [id number] Arrival date & time: 10/31/22  2301     History  Chief Complaint  Patient presents with   Abdominal Pain    Alyssa Riley is a 62 y.o. female.  Patient is a 62 year old female with history of prior DVT, hyperlipidemia, and status post cholecystectomy 3 years ago.  Patient presenting today with complaints of epigastric pain.  This started yesterday morning and has been constant.  She describes a constant pain that is worse with movement and palpation.  There are no alleviating factors.  She denies any relation to food.  She denies any diarrhea, but did feel nauseated earlier.  Patient states this feels like what she experienced both with her gallbladder and when she has had Giardia in the past.  She reports a trip to [place] but has been home for the past 2 weeks.  The history is provided by the patient.       Home Medications Prior to Admission medications   Medication Sig Start Date End Date Taking? Authorizing Provider  Calcium Carbonate-Vit D-Min (CALCIUM 1200 PO) Take 1 tablet by mouth 2 (two) times daily.    [provider]  diphenhydramine-acetaminophen (TYLENOL PM) 25-500 MG TABS tablet Take 1 tablet by mouth at bedtime as needed (sleep/pain).    [provider]  ibuprofen (ADVIL,MOTRIN) 200 MG tablet Take 400 mg by mouth every 4 (four) hours as needed for fever, headache or mild pain.    [provider]  LUTEIN PO Take 1 tablet by mouth daily.    [provider]  Magnesium 250 MG TABS Take 250 mg by mouth daily.    [provider]  Multiple Vitamin (MULTIVITAMIN) capsule Take 1 capsule by mouth daily.    [provider]  OVER THE COUNTER MEDICATION Take 2 capsules by mouth daily in the afternoon. [employer] Vit A, D3, & K2    [provider]  OVER THE COUNTER MEDICATION Take 3 tablets by mouth daily. Juice Plus,  three different gummies    [provider]  Red Yeast Rice Extract (RED YEAST RICE PO) Take 1,200 mg by mouth daily.    [provider]  Turmeric 500 MG TABS Take 500 mg by mouth 2 (two) times daily.    [provider]  vitamin C (ASCORBIC ACID) 500 MG tablet Take 500 mg by mouth daily.    [provider]      Allergies    Patient has no known allergies.    Review of Systems   Review of Systems  All other systems reviewed and are negative.   Physical Exam Updated Vital Signs BP 122/74   Pulse 62   Temp 97.9 F (36.6 C)   Resp 18   Ht '5\' 7"'$  (1.702 m)   Wt 59.4 kg   SpO2 95%   BMI 20.52 kg/m  Physical Exam Vitals and nursing note reviewed.  Constitutional:      General: She is not in acute distress.    Appearance: She is well-developed. She is not diaphoretic.  HENT:     Head: Normocephalic and atraumatic.  Cardiovascular:     Rate and Rhythm: Normal rate and regular rhythm.     Heart sounds: No murmur heard.    No friction rub. No gallop.  Pulmonary:     Effort: Pulmonary effort is normal. No respiratory distress.     Breath sounds: Normal breath sounds. No  wheezing.  Abdominal:     General: Bowel sounds are normal. There is no distension.     Palpations: Abdomen is soft.     Tenderness: There is abdominal tenderness in the epigastric area. There is no right CVA tenderness, left CVA tenderness, guarding or rebound.  Musculoskeletal:        General: Normal range of motion.     Cervical back: Normal range of motion and neck supple.  Skin:    General: Skin is warm and dry.  Neurological:     General: No focal deficit present.     Mental Status: She is alert and oriented to person, place, and time.     ED Results / Procedures / Treatments   Labs (all labs ordered are listed, but only abnormal results are displayed) Labs Reviewed  COMPREHENSIVE METABOLIC PANEL - Abnormal; Notable for the following components:      Result Value    Glucose, Bld 132 (*)    All other components within normal limits  CBC - Abnormal; Notable for the following components:   RBC 3.86 (*)    All other components within normal limits  LIPASE, BLOOD  URINALYSIS, ROUTINE W REFLEX MICROSCOPIC    EKG None  Radiology No results found.  Procedures Procedures    Medications Ordered in ED Medications  oxyCODONE-acetaminophen (PERCOCET/ROXICET) 5-325 MG per tablet 1 tablet (1 tablet Oral Given 10/31/22 2350)    ED Course/ Medical Decision Making/ A&P  Patient is a 62 year old female with history of prior cholecystectomy presenting with complaints of epigastric/periumbilical pain.  This started earlier this morning and has been constant.  She arrives here with stable vital signs and is afebrile.  She has some tenderness to the epigastric region, but abdomen is otherwise benign.  Workup initiated including CBC, CMP, and lipase, all of which were unremarkable.  Urinalysis is clear.  Patient received Percocet in triage with some relief.  CT scan of the abdomen and pelvis obtained showing no acute intra-abdominal process.  There was the mention of mild biliary dilatation, but I suspect this is related to not having a gallbladder.  Her LFTs are normal and not suggestive of an inflammatory process.  There is also the suggestion of some thickening of the bladder wall and correlation with urinalysis was recommended.  Urinalysis was clear.  At this point, the cause of her pain is unclear, but may be related to gastritis.  I will try a course of Prilosec for the next 2 weeks.  I will also prescribe medication for pain and see if she improves.  She understands to return if she develops worsening pain, bloody stools, or for other new and concerning symptoms.  Final Clinical Impression(s) / ED Diagnoses Final diagnoses:  None    Rx / DC Orders ED Discharge Orders     None         Veryl Speak, MD 11/01/22 [phone]

## 2023-09-11 ENCOUNTER — Other Ambulatory Visit: Payer: Self-pay | Admitting: Physician Assistant

## 2023-09-11 DIAGNOSIS — Z0000 Encounter for general adult medical examination without abnormal findings: Secondary | ICD-10-CM

## 2023-10-25 ENCOUNTER — Ambulatory Visit: Payer: No Typology Code available for payment source

## 2023-11-02 ENCOUNTER — Ambulatory Visit
Admission: RE | Admit: 2023-11-02 | Discharge: 2023-11-02 | Disposition: A | Discharge status: Home / Self Care | Payer: No Typology Code available for payment source | Source: Ambulatory Visit | Attending: Physician Assistant | Admitting: Physician Assistant

## 2023-11-02 DIAGNOSIS — Z0000 Encounter for general adult medical examination without abnormal findings: Secondary | ICD-10-CM

## 2023-11-12 ENCOUNTER — Encounter: Payer: Self-pay | Admitting: Physician Assistant

## 2023-11-12 ENCOUNTER — Ambulatory Visit (INDEPENDENT_AMBULATORY_CARE_PROVIDER_SITE_OTHER): Payer: Self-pay | Admitting: Physician Assistant

## 2023-11-12 VITALS — BP 110/70 | HR 73 | Temp 97.3°F | Ht 67.0 in | Wt 135.2 lb

## 2023-11-12 DIAGNOSIS — D682 Hereditary deficiency of other clotting factors: Secondary | ICD-10-CM | POA: Insufficient documentation

## 2023-11-12 DIAGNOSIS — Z0000 Encounter for general adult medical examination without abnormal findings: Secondary | ICD-10-CM

## 2023-11-12 DIAGNOSIS — E559 Vitamin D deficiency, unspecified: Secondary | ICD-10-CM

## 2023-11-12 LAB — COMPREHENSIVE METABOLIC PANEL
ALT: 20 U/L (ref 0–35)
AST: 22 U/L (ref 0–37)
Albumin: 4.4 g/dL (ref 3.5–5.2)
Alkaline Phosphatase: 46 U/L (ref 39–117)
BUN: 14 mg/dL (ref 6–23)
CO2: 26 meq/L (ref 19–32)
Calcium: 9 mg/dL (ref 8.4–10.5)
Chloride: 102 meq/L (ref 96–112)
Creatinine, Ser: 0.8 mg/dL (ref 0.40–1.20)
GFR: 79.88 mL/min (ref >60.00)
Glucose, Bld: 87 mg/dL (ref 70–99)
Potassium: 3.6 meq/L (ref 3.5–5.1)
Sodium: 137 meq/L (ref 135–145)
Total Bilirubin: 0.6 mg/dL (ref 0.2–1.2)
Total Protein: 7.1 g/dL (ref 6.0–8.3)

## 2023-11-12 LAB — CBC WITH DIFFERENTIAL/PLATELET
Basophils Absolute: 0.1 10*3/uL (ref 0.0–0.1)
Basophils Relative: 1.2 % (ref 0.0–3.0)
Eosinophils Absolute: 0.1 10*3/uL (ref 0.0–0.7)
Eosinophils Relative: 2.2 % (ref 0.0–5.0)
HCT: 38.2 % (ref 36.0–46.0)
Hemoglobin: 12.7 g/dL (ref 12.0–15.0)
Lymphocytes Relative: 35.7 % (ref 12.0–46.0)
Lymphs Abs: 1.6 10*3/uL (ref 0.7–4.0)
MCHC: 33.3 g/dL (ref 30.0–36.0)
MCV: 95 fl (ref 78.0–100.0)
Monocytes Absolute: 0.4 10*3/uL (ref 0.1–1.0)
Monocytes Relative: 10.1 % (ref 3.0–12.0)
Neutro Abs: 2.3 10*3/uL (ref 1.4–7.7)
Neutrophils Relative %: 50.8 % (ref 43.0–77.0)
Platelets: 228 10*3/uL (ref 150.0–400.0)
RBC: 4.02 Mil/uL (ref 3.87–5.11)
RDW: 13.1 % (ref 11.5–15.5)
WBC: 4.4 10*3/uL (ref 4.0–10.5)

## 2023-11-12 LAB — LIPID PANEL
Cholesterol: 215 mg/dL — ABNORMAL HIGH (ref 0–200)
HDL: 104 mg/dL (ref >39.00)
LDL Cholesterol: 104 mg/dL — ABNORMAL HIGH (ref 0–99)
NonHDL: 110.74
Total CHOL/HDL Ratio: 2
Triglycerides: 34 mg/dL (ref 0.0–149.0)
VLDL: 6.8 mg/dL (ref 0.0–40.0)

## 2023-11-12 LAB — VITAMIN D 25 HYDROXY (VIT D DEFICIENCY, FRACTURES): VITD: 62.71 ng/mL (ref 30.00–100.00)

## 2023-11-12 NOTE — Patient Instructions (Addendum)
 It was great to see you!  Please go to the lab for blood work.   Our office will call you with your results unless you have chosen to receive results via MyChart.  If your blood work is normal we will follow-up each year for physicals and as scheduled for chronic medical problems.  If anything is abnormal we will treat accordingly and get you in for a follow-up.  Take care,  Alyssa Riley

## 2023-11-12 NOTE — Progress Notes (Signed)
 Subjective:    Alyssa Riley is a 63 y.o. female and is here for a comprehensive physical exam and re-establish care.   HPI  Health Maintenance Due  Topic Date Due   Hepatitis C Screening  Never done   Colonoscopy  Never done   Past medical history: Epigastric pain Had episodes of epigastric pain in early 2024.  Presented to the ED with epigastric pain, appetite changes, and other GI sx.  She was later seen by [facility] and yeast infection in her esophagus was found on her endoscopy.  She notes these episodes occurred about 2-3 weeks after a trip to [place], but is unsure if this contributed to onset of sx in any way.   Acute Concerns: None currently.   Chronic Issues: None.   Health Maintenance: Immunizations -- UTD Colonoscopy -- UTD. Last done at [facility] about 1 year ago.  Mammogram -- UTD, last done 11/02/2023.  PAP -- UTD, last done 11/16/2020. Results were normal. Next due 11/12/2025. Bone Density -- N/A Diet -- No restrictive diets.  Exercise -- Regularly exercising. Works at [employer] 2 days a week and playing tennis 1 day per week.   Sleep habits -- No concerns.  Mood -- Stable currently.   UTD with dentist? - yes UTD with eye doctor? - yes  Weight history: Wt Readings from Last 10 Encounters:  11/12/23 135 lb 4 oz (61.3 kg)  10/31/22 131 lb (59.4 kg)  04/12/21 132 lb (59.9 kg)  11/12/20 130 lb 8 oz (59.2 kg)  11/10/19 132 lb 8 oz (60.1 kg)  11/05/18 124 lb 6.1 oz (56.4 kg)  10/25/17 145 lb 9.6 oz (66 kg)  10/16/17 145 lb 9.6 oz (66 kg)  08/10/17 147 lb 9.6 oz (67 kg)  03/21/17 144 lb 9.6 oz (65.6 kg)   Body mass index is 21.18 kg/m. No LMP recorded. Patient is postmenopausal.  Alcohol use:  reports current alcohol use of about 6.0 standard drinks of alcohol per week.  Tobacco use:  Tobacco Use: Low Risk  (11/12/2023)   Patient History    Smoking Tobacco Use: Never    Smokeless Tobacco Use: Never    Passive Exposure: Not on  file   Eligible for lung cancer screening? no     11/12/2023    8:24 AM  Depression screen PHQ 2/9  Decreased Interest 0  Down, Depressed, Hopeless 0  PHQ - 2 Score 0     Other providers/specialists: Patient Care Team: Jarold Motto, [place] as PCP - General (Physician Assistant) Serena Croissant, MD as Consulting Physician (Hematology and Oncology) Dorothy Puffer, MD as Consulting Physician (Radiation Oncology) Manus Rudd, MD as Consulting Physician (General Surgery) Causey, Larna Daughters, NP as Nurse Practitioner (Hematology and Oncology)    PMHx, SurgHx, SocialHx, Medications, and Allergies were reviewed in the Visit Navigator and updated as appropriate.   Past Medical History:  Diagnosis Date   Breast cancer (HCC) 09/13/2016   left breast    DVT (deep venous thrombosis) (HCC) 06/2016   Left calf   Factor 5 Leiden mutation, heterozygous (HCC)    Giardia    Hypercholesteremia    Personal history of radiation therapy 2018     Past Surgical History:  Procedure Laterality Date   BREAST LUMPECTOMY Left 10/2016   BREAST LUMPECTOMY WITH RADIOACTIVE SEED LOCALIZATION Left 10/10/2016   Procedure: LEFT BREAST LUMPECTOMY WITH RADIOACTIVE SEED LOCALIZATION;  Surgeon: Manus Rudd, MD;  Location: [facility];  Service: General;  Laterality: Left;   CHOLECYSTECTOMY N/A 04/12/2021   Procedure: LAPAROSCOPIC CHOLECYSTECTOMY;  Surgeon: Emelia Loron, MD;  Location[facility];  Service: General;  Laterality: N/A;   COSMETIC SURGERY  lower face lift   June 2024   DILATION AND CURETTAGE OF UTERUS  1998   FRACTURE SURGERY Left    foot at age 32     Family History  Adopted: Yes    Social History   Tobacco Use   Smoking status: Never   Smokeless tobacco: Never  Vaping Use   Vaping status: Never Used  Substance Use Topics   Alcohol use: Yes    Alcohol/week: 6.0 standard drinks of alcohol    Types: 2 Cans of beer, 4 Standard drinks or equivalent per week     Comment: social weekend - no bingeing   Drug use: No    Review of Systems:   Review of Systems  Constitutional:  Negative for chills, fever, malaise/fatigue and weight loss.  HENT:  Negative for hearing loss, sinus pain and sore throat.   Respiratory:  Negative for cough and hemoptysis.   Cardiovascular:  Negative for chest pain, palpitations, leg swelling and PND.  Gastrointestinal:  Negative for abdominal pain, constipation, diarrhea, heartburn, nausea and vomiting.  Genitourinary:  Negative for dysuria, frequency and urgency.  Musculoskeletal:  Negative for back pain, myalgias and neck pain.  Skin:  Negative for itching and rash.  Neurological:  Negative for dizziness, tingling, seizures and headaches.  Endo/Heme/Allergies:  Negative for polydipsia.  Psychiatric/Behavioral:  Negative for depression. The patient is not nervous/anxious.      Objective:   BP 110/70 (BP Location: Left Arm, Patient Position: Sitting, Cuff Size: Normal)   Pulse 73   Temp (!) 97.3 F (36.3 C) (Temporal)   Ht 5\' 7"  (1.702 m)   Wt 135 lb 4 oz (61.3 kg)   SpO2 98%   BMI 21.18 kg/m  Body mass index is 21.18 kg/m.   General Appearance:    Alert, cooperative, no distress, appears stated age  Head:    Normocephalic, without obvious abnormality, atraumatic  Eyes:    PERRL, conjunctiva/corneas clear, EOM's intact, fundi    benign, both eyes  Ears:    Normal TM's and external ear canals, both ears  Nose:   Nares normal, septum midline, mucosa normal, no drainage    or sinus tenderness  Throat:   Lips, mucosa, and tongue normal; teeth and gums normal  Neck:   Supple, symmetrical, trachea midline, no adenopathy;    thyroid:  no enlargement/tenderness/nodules; no carotid   bruit or JVD  Back:     Symmetric, no curvature, ROM normal, no CVA tenderness  Lungs:     Clear to auscultation bilaterally, respirations unlabored  Chest Wall:    No tenderness or deformity   Heart:    Regular rate and rhythm, S1  and S2 normal, no murmur, rub or gallop  Breast Exam:    Deferred  Abdomen:     Soft, non-tender, bowel sounds active all four quadrants,    no masses, no organomegaly  Genitalia:    Deferred  Extremities:   Extremities normal, atraumatic, no cyanosis or edema  Pulses:   2+ and symmetric all extremities  Skin:   Skin color, texture, turgor normal, no rashes or lesions  Lymph nodes:   Cervical, supraclavicular, and axillary nodes normal  Neurologic:   CNII-XII intact, normal strength, sensation and reflexes    throughout    Assessment/Plan:   Routine physical  examination - Plan: CBC with Differential/Platelet, Comprehensive metabolic panel, Lipid panel Today patient counseled on age appropriate routine health concerns for screening and prevention, each reviewed and up to date or declined. Immunizations reviewed and up to date or declined. Labs ordered and reviewed. Risk factors for depression reviewed and negative. Hearing function and visual acuity are intact. ADLs screened and addressed as needed. Functional ability and level of safety reviewed and appropriate. Education, counseling and referrals performed based on assessed risks today. Patient provided with a copy of personalized plan for preventive services.  Vitamin D deficiency - Plan: VITAMIN D 25 Hydroxy (Vit-D Deficiency, Fractures)   I, Isabelle Course, acting as a Neurosurgeon for [employer], [place]., have documented all relevant documentation on the behalf of Jarold Motto, [place], as directed by  Jarold Motto, PA while in the presence of Jarold Motto, [place].  I, Jarold Motto, [place], have reviewed all documentation for this visit. The documentation on 11/12/23 for the exam, diagnosis, procedures, and orders are all accurate and complete.   Jarold Motto, PA-C [facility]

## 2023-11-13 ENCOUNTER — Encounter: Payer: Self-pay | Admitting: Physician Assistant

## 2024-03-29 ENCOUNTER — Other Ambulatory Visit: Payer: Self-pay

## 2024-03-29 DIAGNOSIS — W01198A Fall on same level from slipping, tripping and stumbling with subsequent striking against other object, initial encounter: Secondary | ICD-10-CM | POA: Insufficient documentation

## 2024-03-29 DIAGNOSIS — S01111A Laceration without foreign body of right eyelid and periocular area, initial encounter: Secondary | ICD-10-CM | POA: Insufficient documentation

## 2024-03-29 DIAGNOSIS — Z23 Encounter for immunization: Secondary | ICD-10-CM | POA: Insufficient documentation

## 2024-03-29 DIAGNOSIS — Y92480 Sidewalk as the place of occurrence of the external cause: Secondary | ICD-10-CM | POA: Insufficient documentation

## 2024-03-29 NOTE — ED Triage Notes (Signed)
 Pt POV with lac above R eyebrow after mechanical fall on sidewalk. No LOC, no blod thinners.

## 2024-03-30 ENCOUNTER — Emergency Department (HOSPITAL_BASED_OUTPATIENT_CLINIC_OR_DEPARTMENT_OTHER)
Admission: EM | Admit: 2024-03-30 | Discharge: 2024-03-30 | Disposition: A | Discharge status: Home / Self Care | Attending: Emergency Medicine | Admitting: Emergency Medicine

## 2024-03-30 DIAGNOSIS — W19XXXA Unspecified fall, initial encounter: Secondary | ICD-10-CM

## 2024-03-30 DIAGNOSIS — S01111A Laceration without foreign body of right eyelid and periocular area, initial encounter: Secondary | ICD-10-CM

## 2024-03-30 MED ORDER — TETANUS-DIPHTH-ACELL PERTUSSIS 5-2.5-18.5 LF-MCG/0.5 IM SUSY
0.5000 mL | PREFILLED_SYRINGE | Freq: Once | INTRAMUSCULAR | Status: AC
  Administered 2024-03-30: 0.5 mL via INTRAMUSCULAR
  Filled 2024-03-30: qty 0.5

## 2024-03-30 NOTE — ED Provider Notes (Signed)
 [place] EMERGENCY DEPARTMENT AT [facility]  Provider Note  CSN: [id number] Arrival date & time: 03/29/24 2233  History Chief Complaint  Patient presents with   Alyssa Riley is a 63 y.o. female reports she was leaving the [employer] game earlier tonight when she stumbled and fell, hitting her toe, knee and then her head. She denies LOC, not on anticoagulation. Not worried about her toe or knee, but has a laceration on R eyebrow.    Home Medications Prior to Admission medications   Medication Sig Start Date End Date Taking? Authorizing Provider  B Complex Vitamins (B COMPLEX PO) Take 1 tablet by mouth daily in the afternoon.    [provider]  bimatoprost (LATISSE) 0.03 % ophthalmic solution Place into both eyes as directed. 08/08/23   [provider]  Calcium Carbonate-Vit D-Min (QC CALCIUM-MAGNESIUM-ZINC-D3 PO) Take 2 tablets by mouth daily in the afternoon.    [provider]  Cholecalciferol (VITAMIN D3) 50 MCG (2000 UT) capsule Take 2,000 Units by mouth daily.    [provider]  diphenhydramine -acetaminophen  (TYLENOL  PM) 25-500 MG TABS tablet Take 1 tablet by mouth at bedtime as needed (sleep/pain).    [provider]  ibuprofen (ADVIL,MOTRIN) 200 MG tablet Take 400 mg by mouth every 4 (four) hours as needed for fever, headache or mild pain.    [provider]  LUTEIN PO Take 1 tablet by mouth daily.    [provider]  Magnesium 250 MG TABS Take 250 mg by mouth daily.    [provider]  Multiple Vitamin (MULTIVITAMIN) capsule Take 1 capsule by mouth daily.    [provider]  Omega 3-6-9 Fatty Acids (OMEGA DHA PO) Take by mouth. Liquid; 1 teaspoon daily    [provider]  OVER THE COUNTER MEDICATION Take 3 tablets by mouth daily. Juice Plus, three different gummies    [provider]  OVER THE COUNTER MEDICATION Take 3 Scoops by mouth daily in the afternoon.  Armra Colostrum    [provider]  OVER THE COUNTER MEDICATION Vegetable Glycerin, 1 teaspoon daily    [provider]  Red Yeast Rice Extract (RED YEAST RICE PO) Take 1,200 mg by mouth daily.    [provider]  Turmeric 500 MG TABS Take 1,500 mg by mouth daily in the afternoon.    [provider]  vitamin C (ASCORBIC ACID) 500 MG tablet Take 500 mg by mouth daily.    [provider]     Allergies    Patient has no known allergies.   Review of Systems   Review of Systems Please see HPI for pertinent positives and negatives  Physical Exam BP 116/72   Pulse 73   Temp 98.8 F (37.1 C) (Oral)   Resp 17   Ht 5' 7 (1.702 m)   Wt 59 kg   SpO2 98%   BMI 20.36 kg/m   Physical Exam Vitals and nursing note reviewed.  HENT:     Head: Normocephalic.     Comments: 1cm linear laceration over R eyebrow    Nose: Nose normal.  Eyes:     Extraocular Movements: Extraocular movements intact.     Pupils: Pupils are equal, round, and reactive to light.  Pulmonary:     Effort: Pulmonary effort is normal.  Musculoskeletal:        General: Normal range of motion.     Cervical back: Neck supple. No tenderness.  Skin:    Findings: No rash (on exposed skin).  Neurological:     Mental Status: She is alert and oriented to person, place, and time.  Psychiatric:        Mood and Affect: Mood normal.     ED Results / Procedures / Treatments   EKG None  Procedures .Laceration Repair  Date/Time: 03/30/2024 2:21 AM  Performed by: Roselyn Carlin NOVAK, MD Authorized by: Roselyn Carlin NOVAK, MD   Consent:    Consent obtained:  Verbal   Consent given by:  Patient Anesthesia:    Anesthesia method:  None Laceration details:    Location:  Face   Face location:  R eyebrow   Length (cm):  1 Treatment:    Area cleansed with:  Saline Skin repair:    Repair method:  Tissue adhesive Approximation:    Approximation:  Close Repair type:    Repair  type:  Simple Post-procedure details:    Dressing:  Open (no dressing)   Procedure completion:  Tolerated well, no immediate complications   Medications Ordered in the ED Medications  Tdap (BOOSTRIX ) injection 0.5 mL (0.5 mLs Intramuscular Given 03/30/24 0205)    Initial Impression and Plan  Patient here for mechanical fall with facial injury and laceration, repaired as above. Wound care instructions given. TDAP updated. PCP follow up, RTED for any other concerns.    ED Course       MDM Rules/Calculators/A&P Medical Decision Making Problems Addressed: Fall, initial encounter: acute illness or injury Laceration of right eyebrow, initial encounter: acute illness or injury  Risk Prescription drug management.     Final Clinical Impression(s) / ED Diagnoses Final diagnoses:  Fall, initial encounter  Laceration of right eyebrow, initial encounter    Rx / DC Orders ED Discharge Orders     None        Roselyn Carlin NOVAK, MD 03/30/24 0222

## 2024-03-30 NOTE — ED Notes (Signed)
 Dc instructions given, pt verbalized understanding. Out of ED with steady gait, all belongings and paperwork not in visible distress.

## 2024-05-20 ENCOUNTER — Telehealth: Payer: Self-pay | Admitting: Physician Assistant

## 2024-05-20 ENCOUNTER — Other Ambulatory Visit

## 2024-05-20 ENCOUNTER — Other Ambulatory Visit: Payer: Self-pay | Admitting: Physician Assistant

## 2024-05-20 DIAGNOSIS — R197 Diarrhea, unspecified: Secondary | ICD-10-CM

## 2024-05-20 NOTE — Telephone Encounter (Signed)
 Please see message and advise

## 2024-05-20 NOTE — Telephone Encounter (Signed)
 Pt states she has had diarrhea since the first of August. I scheduled her for tomorrow but she wanted to know if she can leave a stool sample today just in case its viral and she can get it relief started as soon as possible. She would like a call back. Please advise.

## 2024-05-20 NOTE — Telephone Encounter (Signed)
 Spoke to pt told her Alyssa Riley put in orders for Stool culture you can come by today to pickup and bring back today or tomorrow at appt.  Pt verbalized understanding. Front desk notified pt coming in and added to lab schedule. Lab also notified.

## 2024-05-21 ENCOUNTER — Encounter: Payer: Self-pay | Admitting: Physician Assistant

## 2024-05-21 ENCOUNTER — Ambulatory Visit (INDEPENDENT_AMBULATORY_CARE_PROVIDER_SITE_OTHER): Payer: Self-pay | Admitting: Physician Assistant

## 2024-05-21 ENCOUNTER — Ambulatory Visit: Payer: Self-pay | Admitting: Physician Assistant

## 2024-05-21 VITALS — BP 100/70 | HR 80 | Temp 98.2°F | Ht 67.0 in | Wt 129.0 lb

## 2024-05-21 DIAGNOSIS — R197 Diarrhea, unspecified: Secondary | ICD-10-CM

## 2024-05-21 LAB — CBC WITH DIFFERENTIAL/PLATELET
Basophils Absolute: 0 K/uL (ref 0.0–0.1)
Basophils Relative: 0.5 % (ref 0.0–3.0)
Eosinophils Absolute: 0 K/uL (ref 0.0–0.7)
Eosinophils Relative: 0.4 % (ref 0.0–5.0)
HCT: 38.4 % (ref 36.0–46.0)
Hemoglobin: 12.7 g/dL (ref 12.0–15.0)
Lymphocytes Relative: 28.9 % (ref 12.0–46.0)
Lymphs Abs: 1.7 K/uL (ref 0.7–4.0)
MCHC: 33 g/dL (ref 30.0–36.0)
MCV: 94.5 fl (ref 78.0–100.0)
Monocytes Absolute: 0.6 K/uL (ref 0.1–1.0)
Monocytes Relative: 10 % (ref 3.0–12.0)
Neutro Abs: 3.6 K/uL (ref 1.4–7.7)
Neutrophils Relative %: 60.2 % (ref 43.0–77.0)
Platelets: 243 K/uL (ref 150.0–400.0)
RBC: 4.06 Mil/uL (ref 3.87–5.11)
RDW: 13.1 % (ref 11.5–15.5)
WBC: 6 K/uL (ref 4.0–10.5)

## 2024-05-21 LAB — COMPREHENSIVE METABOLIC PANEL WITH GFR
ALT: 13 U/L (ref 0–35)
AST: 18 U/L (ref 0–37)
Albumin: 4.2 g/dL (ref 3.5–5.2)
Alkaline Phosphatase: 43 U/L (ref 39–117)
BUN: 11 mg/dL (ref 6–23)
CO2: 25 meq/L (ref 19–32)
Calcium: 9.2 mg/dL (ref 8.4–10.5)
Chloride: 103 meq/L (ref 96–112)
Creatinine, Ser: 0.71 mg/dL (ref 0.40–1.20)
GFR: 91.84 mL/min (ref >60.00)
Glucose, Bld: 72 mg/dL (ref 70–99)
Potassium: 4 meq/L (ref 3.5–5.1)
Sodium: 138 meq/L (ref 135–145)
Total Bilirubin: 0.7 mg/dL (ref 0.2–1.2)
Total Protein: 7 g/dL (ref 6.0–8.3)

## 2024-05-21 LAB — LIPASE: Lipase: 16 U/L (ref 11.0–59.0)

## 2024-05-21 NOTE — Progress Notes (Signed)
 Alyssa Riley is a 63 y.o. female here for a follow up of a pre-existing problem.  History of Present Illness:   Chief Complaint  Patient presents with   Diarrhea    Pt  c/o diarrhea everyday since beginning of August. Pt did bring stool specimen this morning. She thought it was the Creatinine powder and stopped and now the past week it is worse.    Discussed the use of AI scribe software for clinical note transcription with the patient, who gave verbal consent to proceed.  History of Present Illness Alyssa Riley is a 63 year old female who presents with persistent diarrhea.  She has experienced diarrhea since late July to early August, initially once or twice daily. Symptoms worsened around August 10th, leading to discontinuation of creatine powder, but diarrhea persisted with urgency, especially in the afternoons. In the past week, frequency and urgency have increased, with nocturnal stomach noises and frequent bathroom trips. Dietary modifications have not alleviated symptoms. Energy levels remain stable, and she continues regular activities. Imodium provided partial relief.   No use of Pepto-Bismol, acid reflux, nausea, or pain. No recent antibiotic use or travel to high-risk areas for infections. No blood in stool or urinary symptoms. Past medical history includes diverticulosis and previous Giardia infections.  Most recent colonoscopy in 2023. Normal with 10-year recall recommended. Diverticulosis present in sigmoid colon.  Past Medical History:  Diagnosis Date   Breast cancer (HCC) 09/13/2016   left breast    DVT (deep venous thrombosis) (HCC) 06/2016   Left calf   Factor 5 Leiden mutation, heterozygous (HCC)    Giardia    Hypercholesteremia    Personal history of radiation therapy 2018     Social History   Tobacco Use   Smoking status: Never   Smokeless tobacco: Never  Vaping Use   Vaping status: Never Used  Substance Use Topics   Alcohol use: Yes     Alcohol/week: 6.0 standard drinks of alcohol    Types: 2 Cans of beer, 4 Standard drinks or equivalent per week    Comment: social weekend - no bingeing   Drug use: No    Past Surgical History:  Procedure Laterality Date   BREAST LUMPECTOMY Left 10/2016   BREAST LUMPECTOMY WITH RADIOACTIVE SEED LOCALIZATION Left 10/10/2016   Procedure: LEFT BREAST LUMPECTOMY WITH RADIOACTIVE SEED LOCALIZATION;  Surgeon: Donnice Lima, MD;  Location: [facility];  Service: General;  Laterality: Left;   CHOLECYSTECTOMY N/A 04/12/2021   Procedure: LAPAROSCOPIC CHOLECYSTECTOMY;  Surgeon: Ebbie Donnice, MD;  Location: [facility] OR;  Service: General;  Laterality: N/A;   COSMETIC SURGERY  lower face lift   June 2024   DILATION AND CURETTAGE OF UTERUS  1998   FRACTURE SURGERY Left    foot at age 17    Family History  Adopted: Yes    No Known Allergies  Current Medications:   Current Outpatient Medications:    B Complex Vitamins (B COMPLEX PO), Take 1 tablet by mouth daily in the afternoon., Disp: , Rfl:    bimatoprost (LATISSE) 0.03 % ophthalmic solution, Place into both eyes as directed., Disp: , Rfl:    Calcium Carbonate-Vit D-Min (QC CALCIUM-MAGNESIUM-ZINC-D3 PO), Take 2 tablets by mouth daily in the afternoon., Disp: , Rfl:    Cholecalciferol (VITAMIN D3) 50 MCG (2000 UT) capsule, Take 2,000 Units by mouth daily., Disp: , Rfl:    diphenhydramine -acetaminophen  (TYLENOL  PM) 25-500 MG TABS tablet, Take 1 tablet by mouth at bedtime  as needed (sleep/pain)., Disp: , Rfl:    ibuprofen (ADVIL,MOTRIN) 200 MG tablet, Take 400 mg by mouth every 4 (four) hours as needed for fever, headache or mild pain., Disp: , Rfl:    LUTEIN PO, Take 1 tablet by mouth daily., Disp: , Rfl:    Magnesium 250 MG TABS, Take 250 mg by mouth daily., Disp: , Rfl:    Multiple Vitamin (MULTIVITAMIN) capsule, Take 1 capsule by mouth daily., Disp: , Rfl:    OVER THE COUNTER MEDICATION, Take 3 Scoops by mouth daily in the  afternoon. Armra Colostrum, Disp: , Rfl:    OVER THE COUNTER MEDICATION, Vegetable Glycerin, 1 teaspoon daily, Disp: , Rfl:    Red Yeast Rice Extract (RED YEAST RICE PO), Take 1,200 mg by mouth daily., Disp: , Rfl:    Turmeric 500 MG TABS, Take 1,500 mg by mouth daily in the afternoon., Disp: , Rfl:    vitamin C (ASCORBIC ACID) 500 MG tablet, Take 500 mg by mouth daily., Disp: , Rfl:    Review of Systems:   Negative unless otherwise specified per HPI.  Vitals:   Vitals:   05/21/24 1103  BP: 100/70  Pulse: 80  Temp: 98.2 F (36.8 C)  TempSrc: Temporal  SpO2: 99%  Weight: 129 lb (58.5 kg)  Height: 5' 7 (1.702 m)     Body mass index is 20.2 kg/m.  Physical Exam:   Physical Exam Vitals and nursing note reviewed.  Constitutional:      General: She is not in acute distress.    Appearance: She is well-developed. She is not ill-appearing or toxic-appearing.  Cardiovascular:     Rate and Rhythm: Normal rate and regular rhythm.     Pulses: Normal pulses.     Heart sounds: Normal heart sounds, S1 normal and S2 normal.  Pulmonary:     Effort: Pulmonary effort is normal.     Breath sounds: Normal breath sounds.  Abdominal:     General: Abdomen is flat. Bowel sounds are increased.     Palpations: Abdomen is soft.     Tenderness: There is no abdominal tenderness.  Skin:    General: Skin is warm and dry.  Neurological:     Mental Status: She is alert.     GCS: GCS eye subscore is 4. GCS verbal subscore is 5. GCS motor subscore is 6.  Psychiatric:        Speech: Speech normal.        Behavior: Behavior normal. Behavior is cooperative.     Assessment and Plan:   Assessment and Plan Assessment & Plan Diarrhea, unspecified type Chronic diarrhea with urgency since late July, possibly linked to creatine intake. Differential includes infectious causes, functional bowel disorder, or dietary triggers. C. difficile unlikely. Imodium provides partial relief. - Order stool test for  infectious causes. - Check blood work for electrolytes, infection markers, and kidney function. - Advise continuation of Imodium as needed. - Consider fiber supplement if stool test is normal. - Plan GI referral if symptoms persist. - Keep us  updated on symptom(s) if change in the interim - Push fluids and continue bland foods     Lucie Buttner, PA-C

## 2024-05-21 NOTE — Patient Instructions (Signed)
 Wt Readings from Last 3 Encounters:  05/21/24 129 lb (58.5 kg)  03/29/24 130 lb (59 kg)  11/12/23 135 lb 4 oz (61.3 kg)

## 2024-05-22 LAB — GI PROFILE, STOOL, PCR

## 2024-05-23 ENCOUNTER — Ambulatory Visit: Payer: Self-pay | Admitting: Physician Assistant

## 2024-05-27 ENCOUNTER — Other Ambulatory Visit: Payer: Self-pay | Admitting: Physician Assistant

## 2024-05-27 DIAGNOSIS — R197 Diarrhea, unspecified: Secondary | ICD-10-CM

## 2024-09-24 ENCOUNTER — Other Ambulatory Visit: Payer: Self-pay | Admitting: Physician Assistant

## 2024-09-24 DIAGNOSIS — Z1231 Encounter for screening mammogram for malignant neoplasm of breast: Secondary | ICD-10-CM

## 2024-09-29 ENCOUNTER — Encounter: Payer: Self-pay | Admitting: Physician Assistant

## 2024-09-30 MED ORDER — RIVAROXABAN 10 MG PO TABS: 10.0000 mg | ORAL_TABLET | Freq: Every day | ORAL | 0 refills | Status: AC

## 2024-10-01 NOTE — Telephone Encounter (Signed)
 2 bottles of Xarelto  10 mg put at the front desk for pt to pickup. Documented in chart.

## 2024-11-03 ENCOUNTER — Ambulatory Visit
